# Patient Record
Sex: Female | Born: 1990
Health system: Southern US, Community
[De-identification: ages and names within clinical notes are randomized; demographics above are authoritative.]

## PROBLEM LIST (undated history)

## (undated) DIAGNOSIS — K8020 Calculus of gallbladder without cholecystitis without obstruction: Secondary | ICD-10-CM

## (undated) DIAGNOSIS — I499 Cardiac arrhythmia, unspecified: Secondary | ICD-10-CM

## (undated) DIAGNOSIS — Z8619 Personal history of other infectious and parasitic diseases: Secondary | ICD-10-CM

## (undated) DIAGNOSIS — G43909 Migraine, unspecified, not intractable, without status migrainosus: Secondary | ICD-10-CM

## (undated) DIAGNOSIS — R011 Cardiac murmur, unspecified: Secondary | ICD-10-CM

## (undated) DIAGNOSIS — Z8781 Personal history of (healed) traumatic fracture: Secondary | ICD-10-CM

## (undated) HISTORY — DX: Cardiac arrhythmia, unspecified: I49.9

## (undated) HISTORY — DX: Personal history of other infectious and parasitic diseases: Z86.19

## (undated) HISTORY — DX: Calculus of gallbladder without cholecystitis without obstruction: K80.20

## (undated) HISTORY — DX: Migraine, unspecified, not intractable, without status migrainosus: G43.909

## (undated) HISTORY — DX: Cardiac murmur, unspecified: R01.1

---

## 2011-11-19 ENCOUNTER — Ambulatory Visit (INDEPENDENT_AMBULATORY_CARE_PROVIDER_SITE_OTHER): Payer: BC Managed Care – PPO | Admitting: Family

## 2011-11-19 ENCOUNTER — Ambulatory Visit (HOSPITAL_BASED_OUTPATIENT_CLINIC_OR_DEPARTMENT_OTHER)
Admission: RE | Admit: 2011-11-19 | Discharge: 2011-11-19 | Disposition: A | Discharge status: Home / Self Care | Payer: BC Managed Care – PPO | Source: Ambulatory Visit | Attending: Family | Admitting: Family

## 2011-11-19 ENCOUNTER — Encounter: Payer: Self-pay | Admitting: Family

## 2011-11-19 ENCOUNTER — Telehealth: Payer: Self-pay | Admitting: Family

## 2011-11-19 VITALS — BP 104/72 | HR 82 | Temp 97.9°F | Resp 16 | Ht 61.0 in | Wt 161.0 lb

## 2011-11-19 DIAGNOSIS — Z0000 Encounter for general adult medical examination without abnormal findings: Secondary | ICD-10-CM

## 2011-11-19 DIAGNOSIS — Z309 Encounter for contraceptive management, unspecified: Secondary | ICD-10-CM

## 2011-11-19 DIAGNOSIS — R011 Cardiac murmur, unspecified: Secondary | ICD-10-CM | POA: Insufficient documentation

## 2011-11-19 DIAGNOSIS — G43909 Migraine, unspecified, not intractable, without status migrainosus: Secondary | ICD-10-CM | POA: Insufficient documentation

## 2011-11-19 DIAGNOSIS — E010 Iodine-deficiency related diffuse (endemic) goiter: Secondary | ICD-10-CM

## 2011-11-19 DIAGNOSIS — E049 Nontoxic goiter, unspecified: Secondary | ICD-10-CM | POA: Insufficient documentation

## 2011-11-19 DIAGNOSIS — R350 Frequency of micturition: Secondary | ICD-10-CM | POA: Insufficient documentation

## 2011-11-19 LAB — HEPATIC FUNCTION PANEL
ALT: 19 U/L (ref 0–35)
AST: 17 U/L (ref 0–37)
Albumin: 4.4 g/dL (ref 3.5–5.2)
Alkaline Phosphatase: 83 U/L (ref 39–117)
Bilirubin, Direct: 0.1 mg/dL (ref 0.0–0.3)
Total Bilirubin: 0.3 mg/dL (ref 0.3–1.2)

## 2011-11-19 LAB — POCT URINALYSIS DIPSTICK
Nitrite, UA: NEGATIVE
Protein, UA: NEGATIVE
pH, UA: 7.5

## 2011-11-19 LAB — BASIC METABOLIC PANEL WITH GFR
CO2: 23 mEq/L (ref 19–32)
Calcium: 9.7 mg/dL (ref 8.4–10.5)
Creat: 0.83 mg/dL (ref 0.50–1.10)
GFR, Est African American: >89 mL/min
Glucose, Bld: 86 mg/dL (ref 70–99)
Sodium: 141 mEq/L (ref 135–145)

## 2011-11-19 LAB — LIPID PANEL
LDL Cholesterol: 129 mg/dL — ABNORMAL HIGH (ref 0–99)
VLDL: 22 mg/dL (ref 0–40)

## 2011-11-19 MED ORDER — NORETHINDRONE ACET-ETHINYL EST 1.5-30 MG-MCG PO TABS: 1.0000 | ORAL_TABLET | Freq: Every day | ORAL | Status: DC

## 2011-11-19 MED ORDER — CIPROFLOXACIN HCL 250 MG PO TABS: 250.0000 mg | ORAL_TABLET | Freq: Two times a day (BID) | ORAL | Status: AC

## 2011-11-19 MED ORDER — KETOROLAC TROMETHAMINE 30 MG/ML IJ SOLN
30.0000 mg | Freq: Once | INTRAMUSCULAR | Status: AC
  Administered 2011-11-19: 30 mg via INTRAMUSCULAR

## 2011-11-19 MED ORDER — SUMATRIPTAN SUCCINATE 50 MG PO TABS: ORAL_TABLET | ORAL | Status: DC

## 2011-11-19 NOTE — Assessment & Plan Note (Addendum)
Will try to obtain records from [facility]. Clinically stable. I do not hear murmur today.

## 2011-11-19 NOTE — Progress Notes (Signed)
Subjective:    Patient ID: Erika Woodard, female    DOB: July 23, 1990, 21 y.o.   MRN: [id number]  HPI  Erika Woodard is a 21 yr old female who presents today to establish care.  Her chief complaint today is migraine.  Migraine- Sunday ED at [facility].  Still having migraine.  Prior to that she was seen 1 month ago in the ED with a 3 week hx of migraine.  Reports that she was given an injection which did not help.  She was given rx for tramadol which she reports  is not working.  HA is associated with+ photo/phonophobia.  Pain is located across her forehead.    Denies family hx of migraines.    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  Reports that she had some sort of cardiac procedure at [facility] when she was 21 year old. Not sure what what done.  ?enlarged thyroid per ED.  Review of Systems  Constitutional: Positive for unexpected weight change.       16  pound weight gain in 2 months.   HENT: Positive for rhinorrhea. Negative for hearing loss.   Eyes: Negative for visual disturbance.  Respiratory: Negative for cough.   Cardiovascular: Negative for chest pain.       Occasional heart palpitations  Gastrointestinal: Positive for nausea. Negative for vomiting and diarrhea.  Genitourinary: Positive for frequency. Negative for dysuria.       Irreg periods  Musculoskeletal: Negative for myalgias and arthralgias.  Skin: Negative for rash.  Neurological: Positive for headaches.  Hematological: Negative for adenopathy.  Psychiatric/Behavioral:       Denies depression/anxiety   Past Medical History  Diagnosis Date  . History of chicken pox   . Irregular heart beat   . Heart murmur     since birth  . Migraine     History   Social History  . Marital Status: Single    Spouse Name: N/A    Number of Children: 0  . Years of Education: N/A   Occupational History  . Not on file.   Social History Main Topics  . Smoking status: Never Smoker   . Smokeless tobacco: Never Used  . Alcohol  Use: No  . Drug Use: No  . Sexually Active: Not on file   Other Topics Concern  . Not on file   Social History Narrative   Regular exercise: noCaffeine use: 1-2 dailyAssociates degree Works in Clinical biochemist at [employer]    Past Surgical History  Procedure Date  . No past surgeries     Family History  Problem Relation Age of Onset  . Hypertension Mother   . Crohn's disease Mother   . Diabetes Father   . Asthma Sister   . Heart disease Neg Hx   . Kidney disease Neg Hx     No Known Allergies  Current Outpatient Prescriptions on File Prior to Visit  Medication Sig Dispense Refill  . Norethindrone Acetate-Ethinyl Estradiol (JUNEL,LOESTRIN,MICROGESTIN) 1.5-30 MG-MCG tablet Take 1 tablet by mouth daily.  1 Package  6  . SUMAtriptan (IMITREX) 50 MG tablet One tab at start of migraine.  May repeat once two hours later if no improvement.  10 tablet  0    BP 104/72  Pulse 82  Temp 97.9 F (36.6 C) (Oral)  Resp 16  Ht 5\' 1"  (1.549 m)  Wt 161 lb (73.029 kg)  BMI 30.42 kg/m2  SpO2 99%  LMP 10/18/2011       Objective:  Physical Exam  Constitutional: She is oriented to person, place, and time. She appears well-developed and well-nourished. No distress.  HENT:  Head: Normocephalic and atraumatic.  Right Ear: Tympanic membrane and ear canal normal.  Left Ear: Tympanic membrane and ear canal normal.  Mouth/Throat: No posterior oropharyngeal edema or posterior oropharyngeal erythema.  Eyes: No scleral icterus.  Neck: Normal range of motion. Thyromegaly present.  Cardiovascular: Normal rate and regular rhythm.   No murmur heard. Pulmonary/Chest: Effort normal and breath sounds normal. No respiratory distress. She has no wheezes. She has no rales. She exhibits no tenderness.  Abdominal: Soft. Bowel sounds are normal. She exhibits no distension and no mass. There is no tenderness. There is no rebound and no guarding.  Lymphadenopathy:    She has no cervical adenopathy.    Neurological: She is alert and oriented to person, place, and time.  Skin: Skin is warm and dry.  Psychiatric: She has a normal mood and affect. Her behavior is normal. Judgment and thought content normal.          Assessment & Plan:

## 2011-11-19 NOTE — Telephone Encounter (Signed)
Attempted to reach pt and left detailed message on cell # and to call if any questions.

## 2011-11-19 NOTE — Assessment & Plan Note (Signed)
Pt with recent weight gain.  Obtain TSH, obtain thyroid ultrasound.

## 2011-11-19 NOTE — Telephone Encounter (Signed)
Pls call pt and let her know ua shows possible UTI.  I have sent cipro to her pharmacy.

## 2011-11-19 NOTE — Assessment & Plan Note (Signed)
Will add imitrex prn.  IM toradol in office today.  Add OCP for birth control and hopefully will also help with HA's.

## 2011-11-19 NOTE — Patient Instructions (Addendum)
Please complete your blood work prior to leaving. Complete your thyroid ultrasound prior to leaving. Follow up in 1 month for a complete physical and pap smear.  Welcome to [facility]!

## 2011-11-19 NOTE — Assessment & Plan Note (Signed)
UA suggestive of UTI in setting of urinary frequency. Rx with Cipro.  Culture urine.

## 2011-11-20 ENCOUNTER — Encounter: Payer: Self-pay | Admitting: Family

## 2011-11-20 LAB — CBC WITH DIFFERENTIAL/PLATELET
Basophils Relative: 0 % (ref 0–1)
Eosinophils Absolute: 0.1 10*3/uL (ref 0.0–0.7)
HCT: 43 % (ref 36.0–46.0)
Hemoglobin: 13.9 g/dL (ref 12.0–15.0)
Lymphs Abs: 2.5 10*3/uL (ref 0.7–4.0)
MCH: 27.4 pg (ref 26.0–34.0)
MCHC: 32.3 g/dL (ref 30.0–36.0)
MCV: 84.6 fL (ref 78.0–100.0)
Monocytes Absolute: 0.4 10*3/uL (ref 0.1–1.0)
Monocytes Relative: 6 % (ref 3–12)
Neutrophils Relative %: 50 % (ref 43–77)
RBC: 5.08 MIL/uL (ref 3.87–5.11)

## 2011-11-20 NOTE — Telephone Encounter (Signed)
Erika Woodard, please see work note.  Pt to pick up Monday.

## 2011-11-21 LAB — URINE CULTURE: Colony Count: 9000

## 2011-12-15 ENCOUNTER — Encounter: Payer: Self-pay | Admitting: Family

## 2011-12-17 ENCOUNTER — Ambulatory Visit (INDEPENDENT_AMBULATORY_CARE_PROVIDER_SITE_OTHER): Payer: BC Managed Care – PPO | Admitting: Family

## 2011-12-17 ENCOUNTER — Encounter: Payer: Self-pay | Admitting: Family

## 2011-12-17 VITALS — BP 118/70 | HR 64 | Temp 98.1°F | Resp 16 | Ht 61.0 in | Wt 161.1 lb

## 2011-12-17 DIAGNOSIS — Z23 Encounter for immunization: Secondary | ICD-10-CM

## 2011-12-17 DIAGNOSIS — Z0000 Encounter for general adult medical examination without abnormal findings: Secondary | ICD-10-CM | POA: Insufficient documentation

## 2011-12-17 NOTE — Patient Instructions (Addendum)
Preventive Care for Adults, Female A healthy lifestyle and preventive care can promote health and wellness. Preventive health guidelines for women include the following key practices.  A routine yearly physical is a good way to check with your caregiver about your health and preventive screening. It is a chance to share any concerns and updates on your health, and to receive a thorough exam.   Visit your dentist for a routine exam and preventive care every 6 months. Brush your teeth twice a day and floss once a day. Good oral hygiene prevents tooth decay and gum disease.   The frequency of eye exams is based on your age, health, family medical history, use of contact lenses, and other factors. Follow your caregiver's recommendations for frequency of eye exams.   Eat a healthy diet. Foods like vegetables, fruits, whole grains, low-fat dairy products, and lean protein foods contain the nutrients you need without too many calories. Decrease your intake of foods high in solid fats, added sugars, and salt. Eat the right amount of calories for you.Get information about a proper diet from your caregiver, if necessary.   Regular physical exercise is one of the most important things you can do for your health. Most adults should get at least 150 minutes of moderate-intensity exercise (any activity that increases your heart rate and causes you to sweat) each week. In addition, most adults need muscle-strengthening exercises on 2 or more days a week.   Maintain a healthy weight. The body mass index (BMI) is a screening tool to identify possible weight problems. It provides an estimate of body fat based on height and weight. Your caregiver can help determine your BMI, and can help you achieve or maintain a healthy weight.For adults 20 years and older:   A BMI below 18.5 is considered underweight.   A BMI of 18.5 to 24.9 is normal.   A BMI of 25 to 29.9 is considered overweight.   A BMI of 30 and above is  considered obese.   Maintain normal blood lipids and cholesterol levels by exercising and minimizing your intake of saturated fat. Eat a balanced diet with plenty of fruit and vegetables. Blood tests for lipids and cholesterol should begin at age 20 and be repeated every 5 years. If your lipid or cholesterol levels are high, you are over 50, or you are at high risk for heart disease, you may need your cholesterol levels checked more frequently.Ongoing high lipid and cholesterol levels should be treated with medicines if diet and exercise are not effective.   If you smoke, find out from your caregiver how to quit. If you do not use tobacco, do not start.   If you are pregnant, do not drink alcohol. If you are breastfeeding, be very cautious about drinking alcohol. If you are not pregnant and choose to drink alcohol, do not exceed 1 drink per day. One drink is considered to be 12 ounces (355 mL) of beer, 5 ounces (148 mL) of wine, or 1.5 ounces (44 mL) of liquor.   Avoid use of street drugs. Do not share needles with anyone. Ask for help if you need support or instructions about stopping the use of drugs.   High blood pressure causes heart disease and increases the risk of stroke. Your blood pressure should be checked at least every 1 to 2 years. Ongoing high blood pressure should be treated with medicines if weight loss and exercise are not effective.   If you are 55 to 22   years old, ask your caregiver if you should take aspirin to prevent strokes.   Diabetes screening involves taking a blood sample to check your fasting blood sugar level. This should be done once every 3 years, after age 45, if you are within normal weight and without risk factors for diabetes. Testing should be considered at a younger age or be carried out more frequently if you are overweight and have at least 1 risk factor for diabetes.   Breast cancer screening is essential preventive care for women. You should practice "breast  self-awareness." This means understanding the normal appearance and feel of your breasts and may include breast self-examination. Any changes detected, no matter how small, should be reported to a caregiver. Women in their 20s and 30s should have a clinical breast exam (CBE) by a caregiver as part of a regular health exam every 1 to 3 years. After age 40, women should have a CBE every year. Starting at age 40, women should consider having a mammography (breast X-ray test) every year. Women who have a family history of breast cancer should talk to their caregiver about genetic screening. Women at a high risk of breast cancer should talk to their caregivers about having magnetic resonance imaging (MRI) and a mammography every year.   The Pap test is a screening test for cervical cancer. A Pap test can show cell changes on the cervix that might become cervical cancer if left untreated. A Pap test is a procedure in which cells are obtained and examined from the lower end of the uterus (cervix).   Women should have a Pap test starting at age 21.   Between ages 21 and 29, Pap tests should be repeated every 2 years.   Beginning at age 30, you should have a Pap test every 3 years as long as the past 3 Pap tests have been normal.   Some women have medical problems that increase the chance of getting cervical cancer. Talk to your caregiver about these problems. It is especially important to talk to your caregiver if a new problem develops soon after your last Pap test. In these cases, your caregiver may recommend more frequent screening and Pap tests.   The above recommendations are the same for women who have or have not gotten the vaccine for human papillomavirus (HPV).   If you had a hysterectomy for a problem that was not cancer or a condition that could lead to cancer, then you no longer need Pap tests. Even if you no longer need a Pap test, a regular exam is a good idea to make sure no other problems are  starting.   If you are between ages 65 and 70, and you have had normal Pap tests going back 10 years, you no longer need Pap tests. Even if you no longer need a Pap test, a regular exam is a good idea to make sure no other problems are starting.   If you have had past treatment for cervical cancer or a condition that could lead to cancer, you need Pap tests and screening for cancer for at least 20 years after your treatment.   If Pap tests have been discontinued, risk factors (such as a new sexual partner) need to be reassessed to determine if screening should be resumed.   The HPV test is an additional test that may be used for cervical cancer screening. The HPV test looks for the virus that can cause the cell changes on the cervix.   The cells collected during the Pap test can be tested for HPV. The HPV test could be used to screen women aged 30 years and older, and should be used in women of any age who have unclear Pap test results. After the age of 30, women should have HPV testing at the same frequency as a Pap test.   Colorectal cancer can be detected and often prevented. Most routine colorectal cancer screening begins at the age of 50 and continues through age 75. However, your caregiver may recommend screening at an earlier age if you have risk factors for colon cancer. On a yearly basis, your caregiver may provide home test kits to check for hidden blood in the stool. Use of a small camera at the end of a tube, to directly examine the colon (sigmoidoscopy or colonoscopy), can detect the earliest forms of colorectal cancer. Talk to your caregiver about this at age 50, when routine screening begins. Direct examination of the colon should be repeated every 5 to 10 years through age 75, unless early forms of pre-cancerous polyps or small growths are found.   Hepatitis C blood testing is recommended for all people born from 1945 through 1965 and any individual with known risks for hepatitis C.    Practice safe sex. Use condoms and avoid high-risk sexual practices to reduce the spread of sexually transmitted infections (STIs). STIs include gonorrhea, chlamydia, syphilis, trichomonas, herpes, HPV, and human immunodeficiency virus (HIV). Herpes, HIV, and HPV are viral illnesses that have no cure. They can result in disability, cancer, and death. Sexually active women aged 25 and younger should be checked for chlamydia. Older women with new or multiple partners should also be tested for chlamydia. Testing for other STIs is recommended if you are sexually active and at increased risk.   Osteoporosis is a disease in which the bones lose minerals and strength with aging. This can result in serious bone fractures. The risk of osteoporosis can be identified using a bone density scan. Women ages 65 and over and women at risk for fractures or osteoporosis should discuss screening with their caregivers. Ask your caregiver whether you should take a calcium supplement or vitamin D to reduce the rate of osteoporosis.   Menopause can be associated with physical symptoms and risks. Hormone replacement therapy is available to decrease symptoms and risks. You should talk to your caregiver about whether hormone replacement therapy is right for you.   Use sunscreen with sun protection factor (SPF) of 30 or more. Apply sunscreen liberally and repeatedly throughout the day. You should seek shade when your shadow is shorter than you. Protect yourself by wearing long sleeves, pants, a wide-brimmed hat, and sunglasses year round, whenever you are outdoors.   Once a month, do a whole body skin exam, using a mirror to look at the skin on your back. Notify your caregiver of new moles, moles that have irregular borders, moles that are larger than a pencil eraser, or moles that have changed in shape or color.   Stay current with required immunizations.   Influenza. You need a dose every fall (or winter). The composition of  the flu vaccine changes each year, so being vaccinated once is not enough.   Pneumococcal polysaccharide. You need 1 to 2 doses if you smoke cigarettes or if you have certain chronic medical conditions. You need 1 dose at age 65 (or older) if you have never been vaccinated.   Tetanus, diphtheria, pertussis (Tdap, Td). Get 1 dose of   Tdap vaccine if you are younger than age 65, are over 65 and have contact with an infant, are a healthcare worker, are pregnant, or simply want to be protected from whooping cough. After that, you need a Td booster dose every 10 years. Consult your caregiver if you have not had at least 3 tetanus and diphtheria-containing shots sometime in your life or have a deep or dirty wound.   HPV. You need this vaccine if you are a woman age 26 or younger. The vaccine is given in 3 doses over 6 months.   Measles, mumps, rubella (MMR). You need at least 1 dose of MMR if you were born in 1957 or later. You may also need a second dose.   Meningococcal. If you are age 19 to 21 and a first-year college student living in a residence hall, or have one of several medical conditions, you need to get vaccinated against meningococcal disease. You may also need additional booster doses.   Zoster (shingles). If you are age 60 or older, you should get this vaccine.   Varicella (chickenpox). If you have never had chickenpox or you were vaccinated but received only 1 dose, talk to your caregiver to find out if you need this vaccine.   Hepatitis A. You need this vaccine if you have a specific risk factor for hepatitis A virus infection or you simply wish to be protected from this disease. The vaccine is usually given as 2 doses, 6 to 18 months apart.   Hepatitis B. You need this vaccine if you have a specific risk factor for hepatitis B virus infection or you simply wish to be protected from this disease. The vaccine is given in 3 doses, usually over 6 months.  Preventive Services /  Frequency Ages 19 to 39  Blood pressure check.** / Every 1 to 2 years.   Lipid and cholesterol check.** / Every 5 years beginning at age 20.   Clinical breast exam.** / Every 3 years for women in their 20s and 30s.   Pap test.** / Every 2 years from ages 21 through 29. Every 3 years starting at age 30 through age 65 or 70 with a history of 3 consecutive normal Pap tests.   HPV screening.** / Every 3 years from ages 30 through ages 65 to 70 with a history of 3 consecutive normal Pap tests.   Hepatitis C blood test.** / For any individual with known risks for hepatitis C.   Skin self-exam. / Monthly.   Influenza immunization.** / Every year.   Pneumococcal polysaccharide immunization.** / 1 to 2 doses if you smoke cigarettes or if you have certain chronic medical conditions.   Tetanus, diphtheria, pertussis (Tdap, Td) immunization. / A one-time dose of Tdap vaccine. After that, you need a Td booster dose every 10 years.   HPV immunization. / 3 doses over 6 months, if you are 26 and younger.   Measles, mumps, rubella (MMR) immunization. / You need at least 1 dose of MMR if you were born in 1957 or later. You may also need a second dose.   Meningococcal immunization. / 1 dose if you are age 19 to 21 and a first-year college student living in a residence hall, or have one of several medical conditions, you need to get vaccinated against meningococcal disease. You may also need additional booster doses.   Varicella immunization.** / Consult your caregiver.   Hepatitis A immunization.** / Consult your caregiver. 2 doses, 6 to 18 months   apart.   Hepatitis B immunization.** / Consult your caregiver. 3 doses usually over 6 months.  Ages 40 to 64  Blood pressure check.** / Every 1 to 2 years.   Lipid and cholesterol check.** / Every 5 years beginning at age 20.   Clinical breast exam.** / Every year after age 40.   Mammogram.** / Every year beginning at age 40 and continuing for as  long as you are in good health. Consult with your caregiver.   Pap test.** / Every 3 years starting at age 30 through age 65 or 70 with a history of 3 consecutive normal Pap tests.   HPV screening.** / Every 3 years from ages 30 through ages 65 to 70 with a history of 3 consecutive normal Pap tests.   Fecal occult blood test (FOBT) of stool. / Every year beginning at age 50 and continuing until age 75. You may not need to do this test if you get a colonoscopy every 10 years.   Flexible sigmoidoscopy or colonoscopy.** / Every 5 years for a flexible sigmoidoscopy or every 10 years for a colonoscopy beginning at age 50 and continuing until age 75.   Hepatitis C blood test.** / For all people born from 1945 through 1965 and any individual with known risks for hepatitis C.   Skin self-exam. / Monthly.   Influenza immunization.** / Every year.   Pneumococcal polysaccharide immunization.** / 1 to 2 doses if you smoke cigarettes or if you have certain chronic medical conditions.   Tetanus, diphtheria, pertussis (Tdap, Td) immunization.** / A one-time dose of Tdap vaccine. After that, you need a Td booster dose every 10 years.   Measles, mumps, rubella (MMR) immunization. / You need at least 1 dose of MMR if you were born in 1957 or later. You may also need a second dose.   Varicella immunization.** / Consult your caregiver.   Meningococcal immunization.** / Consult your caregiver.   Hepatitis A immunization.** / Consult your caregiver. 2 doses, 6 to 18 months apart.   Hepatitis B immunization.** / Consult your caregiver. 3 doses, usually over 6 months.  Ages 65 and over  Blood pressure check.** / Every 1 to 2 years.   Lipid and cholesterol check.** / Every 5 years beginning at age 20.   Clinical breast exam.** / Every year after age 40.   Mammogram.** / Every year beginning at age 40 and continuing for as long as you are in good health. Consult with your caregiver.   Pap test.** /  Every 3 years starting at age 30 through age 65 or 70 with a 3 consecutive normal Pap tests. Testing can be stopped between 65 and 70 with 3 consecutive normal Pap tests and no abnormal Pap or HPV tests in the past 10 years.   HPV screening.** / Every 3 years from ages 30 through ages 65 or 70 with a history of 3 consecutive normal Pap tests. Testing can be stopped between 65 and 70 with 3 consecutive normal Pap tests and no abnormal Pap or HPV tests in the past 10 years.   Fecal occult blood test (FOBT) of stool. / Every year beginning at age 50 and continuing until age 75. You may not need to do this test if you get a colonoscopy every 10 years.   Flexible sigmoidoscopy or colonoscopy.** / Every 5 years for a flexible sigmoidoscopy or every 10 years for a colonoscopy beginning at age 50 and continuing until age 75.   Hepatitis   C blood test.** / For all people born from 1945 through 1965 and any individual with known risks for hepatitis C.   Osteoporosis screening.** / A one-time screening for women ages 65 and over and women at risk for fractures or osteoporosis.   Skin self-exam. / Monthly.   Influenza immunization.** / Every year.   Pneumococcal polysaccharide immunization.** / 1 dose at age 65 (or older) if you have never been vaccinated.   Tetanus, diphtheria, pertussis (Tdap, Td) immunization. / A one-time dose of Tdap vaccine if you are over 65 and have contact with an infant, are a healthcare worker, or simply want to be protected from whooping cough. After that, you need a Td booster dose every 10 years.   Varicella immunization.** / Consult your caregiver.   Meningococcal immunization.** / Consult your caregiver.   Hepatitis A immunization.** / Consult your caregiver. 2 doses, 6 to 18 months apart.   Hepatitis B immunization.** / Check with your caregiver. 3 doses, usually over 6 months.  ** Family history and personal history of risk and conditions may change your caregiver's  recommendations. Document Released: 06/01/2001 Document Revised: 03/25/2011 Document Reviewed: 08/31/2010 [employer] Patient Information 2012 [employer]. 

## 2011-12-17 NOTE — Assessment & Plan Note (Signed)
Pt counseled on diet, exercise and weight loss.  Tdap today.  Pt to schedule follow up visit for pap.

## 2011-12-17 NOTE — Progress Notes (Signed)
Subjective:    Patient ID: Erika Woodard, female    DOB: [birth year], 21 y.o.   MRN: [id number]  HPI  Preventative-  Not exercising regularly.  Diet is not that healthy. Due for pap but she wishes to reschedule to another day. Due for tetanus.   Review of Systems  Constitutional:       + weight gain last 4 months (35 pounds)  HENT: Negative for congestion.        Occasional issues with hearing  Eyes: Negative for visual disturbance.  Respiratory: Negative for cough and shortness of breath.   Cardiovascular: Negative for chest pain.  Gastrointestinal: Negative for nausea, vomiting and diarrhea.  Genitourinary: Negative for dysuria and frequency.  Musculoskeletal: Negative for myalgias and arthralgias.  Skin: Negative for rash.  Neurological: Positive for headaches.  Hematological: Negative for adenopathy.  Psychiatric/Behavioral:       Denies depression/anxiety   Past Medical History  Diagnosis Date  . History of chicken pox   . Irregular heart beat   . Heart murmur     since birth  . Migraine     History   Social History  . Marital Status: Single    Spouse Name: N/A    Number of Children: 0  . Years of Education: N/A   Occupational History  . Not on file.   Social History Main Topics  . Smoking status: Never Smoker   . Smokeless tobacco: Never Used  . Alcohol Use: No  . Drug Use: No  . Sexually Active: Not on file   Other Topics Concern  . Not on file   Social History Narrative   Regular exercise: noCaffeine use: 1-2 dailyAssociates degree Works in Clinical biochemist at [employer]    Past Surgical History  Procedure Date  . No past surgeries     Family History  Problem Relation Age of Onset  . Hypertension Mother   . Crohn's disease Mother   . Diabetes Father   . Asthma Sister   . Heart disease Neg Hx   . Kidney disease Neg Hx     No Known Allergies  Current Outpatient Prescriptions on File Prior to Visit  Medication Sig Dispense Refill  .  Norethindrone Acetate-Ethinyl Estradiol (JUNEL,LOESTRIN,MICROGESTIN) 1.5-30 MG-MCG tablet Take 1 tablet by mouth daily.  1 Package  6  . SUMAtriptan (IMITREX) 50 MG tablet One tab at start of migraine.  May repeat once two hours later if no improvement.  10 tablet  0  . traMADol (ULTRAM) 50 MG tablet Take 50 mg by mouth as needed.        BP 118/70  Pulse 64  Temp 98.1 F (36.7 C) (Oral)  Resp 16  Ht 5\' 1"  (1.549 m)  Wt 161 lb 1.3 oz (73.065 kg)  BMI 30.44 kg/m2  SpO2 97%  LMP 12/17/2011       Objective:   Physical Exam Physical Exam  Constitutional: She is oriented to person, place, and time. She appears well-developed and well-nourished. No distress.  HENT:  Head: Normocephalic and atraumatic.  Right Ear: Tympanic membrane and ear canal normal.  Left Ear: Tympanic membrane and ear canal normal.  Mouth/Throat: Oropharynx is clear and moist.  Eyes: Pupils are equal, round, and reactive to light. No scleral icterus.  Neck: Normal range of motion. No thyromegaly present.  Cardiovascular: Normal rate and regular rhythm.   No murmur heard. Pulmonary/Chest: Effort normal and breath sounds normal. No respiratory distress. He has no wheezes. She has no  rales. She exhibits no tenderness.  Abdominal: Soft. Bowel sounds are normal. He exhibits no distension and no mass. There is no tenderness. There is no rebound and no guarding.  Musculoskeletal: She exhibits no edema.  Lymphadenopathy:    She has no cervical adenopathy.  Neurological: She is alert and oriented to person, place, and time. She exhibits normal muscle tone. Coordination normal.  Skin: Skin is warm and dry.  Psychiatric: She has a normal mood and affect. Her behavior is normal. Judgment and thought content normal.  Breast/pelvic: deferred to next visit.         Assessment & Plan:          Assessment & Plan:

## 2011-12-24 ENCOUNTER — Encounter: Payer: Self-pay | Admitting: Family

## 2011-12-24 ENCOUNTER — Ambulatory Visit (INDEPENDENT_AMBULATORY_CARE_PROVIDER_SITE_OTHER): Payer: BC Managed Care – PPO | Admitting: Family

## 2011-12-24 ENCOUNTER — Other Ambulatory Visit (HOSPITAL_COMMUNITY)
Admission: RE | Admit: 2011-12-24 | Discharge: 2011-12-24 | Disposition: A | Discharge status: Home / Self Care | Payer: BC Managed Care – PPO | Source: Ambulatory Visit | Attending: Family | Admitting: Family

## 2011-12-24 VITALS — BP 90/60 | HR 72 | Temp 98.0°F | Resp 16 | Ht 61.0 in | Wt 162.0 lb

## 2011-12-24 DIAGNOSIS — G43909 Migraine, unspecified, not intractable, without status migrainosus: Secondary | ICD-10-CM

## 2011-12-24 DIAGNOSIS — Z01419 Encounter for gynecological examination (general) (routine) without abnormal findings: Secondary | ICD-10-CM | POA: Insufficient documentation

## 2011-12-24 DIAGNOSIS — Z113 Encounter for screening for infections with a predominantly sexual mode of transmission: Secondary | ICD-10-CM | POA: Insufficient documentation

## 2011-12-24 MED ORDER — LEVONORGEST-ETH ESTRAD 91-DAY 0.15-0.03 &0.01 MG PO TABS: 1.0000 | ORAL_TABLET | Freq: Every day | ORAL | Status: DC

## 2011-12-24 NOTE — Progress Notes (Signed)
  Subjective:    Patient ID: Erika Woodard, female    DOB: 11/14/1990, 21 y.o.   MRN: [id number]  HPI  Ms.  Woodard is a 21 yr old female who presents today for her pap smear.   Headaches- 3 days on 2-3 days off.  Having trouble working or driving during migraines.  Imitrex didn't help.  LMP 7/30.  Not sexually active.  Periods have been heavier than usual.  7-8 days long.  + cramping.     Review of Systems See HPI  Past Medical History  Diagnosis Date  . History of chicken pox   . Irregular heart beat   . Heart murmur     since birth  . Migraine     History   Social History  . Marital Status: Single    Spouse Name: N/A    Number of Children: 0  . Years of Education: N/A   Occupational History  . Not on file.   Social History Main Topics  . Smoking status: Never Smoker   . Smokeless tobacco: Never Used  . Alcohol Use: No  . Drug Use: No  . Sexually Active: Not on file   Other Topics Concern  . Not on file   Social History Narrative   Regular exercise: noCaffeine use: 1-2 dailyAssociates degree Works in Clinical biochemist at [employer]    Past Surgical History  Procedure Date  . No past surgeries     Family History  Problem Relation Age of Onset  . Hypertension Mother   . Crohn's disease Mother   . Diabetes Father   . Asthma Sister   . Heart disease Neg Hx   . Kidney disease Neg Hx     No Known Allergies  Current Outpatient Prescriptions on File Prior to Visit  Medication Sig Dispense Refill  . Calcium Carbonate-Vitamin D (CALTRATE 600+D) 600-400 MG-UNIT per tablet Take 1 tablet by mouth 2 (two) times daily.      . Levonorgestrel-Ethinyl Estradiol (AMETHIA,CAMRESE) 0.15-0.03 &0.01 MG tablet Take 1 tablet by mouth daily.  1 Package  4  . traMADol (ULTRAM) 50 MG tablet Take 50 mg by mouth as needed.        BP 90/60  Pulse 72  Temp 98 F (36.7 C) (Oral)  Resp 16  Ht 5\' 1"  (1.549 m)  Wt 162 lb 0.6 oz (73.501 kg)  BMI 30.62 kg/m2  SpO2 99%   LMP 12/17/2011       Objective:   Physical Exam  Constitutional: She appears well-developed and well-nourished. No distress.  Cardiovascular: Normal rate and regular rhythm.   No murmur heard. Pulmonary/Chest: Effort normal and breath sounds normal. No respiratory distress. She has no wheezes. She has no rales. She exhibits no tenderness.  Genitourinary:       Breasts: Examined lying arge or axillary adenopathy.  Left: Without masses, retractions, discharge or axillary adenopathy.  Inguinal/mons: Normal without inguinal adenopathy  External genitalia: Normal  BUS/Urethra/Skene's glands: Normal  Bladder: Normal  Vagina: Normal  Cervix: Normal (pap performed with chaperone) Uterus: normal in size, shape and contour. Midline and mobile  Adnexa/parametria:  Rt: Without masses or tenderness.  Lt: Without masses or tenderness.  Anus and perineum: Normal            Assessment & Plan:

## 2011-12-24 NOTE — Patient Instructions (Addendum)
You will be contacted about your referral to neurology for your migraines Please let us know if you have not heard back within 1 week about your referral. Follow up with [place] in 1 year, sooner if problems or concerns.

## 2011-12-24 NOTE — Assessment & Plan Note (Signed)
Unchanged.  Will refer to Neurology for further evaluation/treatment.

## 2011-12-24 NOTE — Assessment & Plan Note (Addendum)
Pap performed today.  Will also send GC/Chlamydia screen. Will change microgestin to camrese due to long/heavy periods.

## 2011-12-28 ENCOUNTER — Telehealth: Payer: Self-pay | Admitting: Family

## 2011-12-28 NOTE — Telephone Encounter (Signed)
See note

## 2012-01-04 ENCOUNTER — Telehealth: Payer: Self-pay | Admitting: *Deleted

## 2012-01-04 MED ORDER — NORETHINDRONE ACET-ETHINYL EST 1.5-30 MG-MCG PO TABS: 1.0000 | ORAL_TABLET | Freq: Every day | ORAL | Status: DC

## 2012-01-04 NOTE — Telephone Encounter (Signed)
Left detailed message on pt's cell# re: completion and to call if any questions.

## 2012-01-04 NOTE — Telephone Encounter (Signed)
Received message from pt stating she cannot afford the new OCP (3 month pk) that she was prescribed. Pt would like to go back on her previous OCP medication. Please advise.

## 2012-01-04 NOTE — Telephone Encounter (Signed)
OK sent to pharmacy.

## 2012-01-27 ENCOUNTER — Encounter: Payer: Self-pay | Admitting: Internal Medicine

## 2012-01-27 ENCOUNTER — Ambulatory Visit (INDEPENDENT_AMBULATORY_CARE_PROVIDER_SITE_OTHER): Payer: BC Managed Care – PPO | Admitting: Internal Medicine

## 2012-01-27 VITALS — BP 104/74 | HR 69 | Temp 98.5°F | Resp 12 | Wt 164.1 lb

## 2012-01-27 DIAGNOSIS — J029 Acute pharyngitis, unspecified: Secondary | ICD-10-CM

## 2012-01-27 LAB — POCT RAPID STREP A (OFFICE): Rapid Strep A Screen: NEGATIVE

## 2012-01-27 MED ORDER — AMOXICILLIN-POT CLAVULANATE 875-125 MG PO TABS: 1.0000 | ORAL_TABLET | Freq: Two times a day (BID) | ORAL | Status: AC

## 2012-01-27 NOTE — Progress Notes (Signed)
  Subjective:    Patient ID: Erika Woodard, female    DOB: Jan 17, 1991, 21 y.o.   MRN: [id number]  HPI patient presents to clinic for evaluation of sore throat. Notes over seven-day history of sore throat, slight cough and bilateral ear discomfort without drainage fever or chills. No known strep exposure. No alleviating or exacerbating factors. Does note history of recurrent pharyngitis and was previously recommended for ENT consultation however to could not keep appointment because of school commitments. Has questions about possible tonsillectomy.  Past Medical History  Diagnosis Date  . History of chicken pox   . Irregular heart beat   . Heart murmur     since birth  . Migraine    Past Surgical History  Procedure Date  . No past surgeries     reports that she has never smoked. She has never used smokeless tobacco. She reports that she does not drink alcohol or use illicit drugs. family history includes Asthma in her sister; Crohn's disease in her mother; Diabetes in her father; and Hypertension in her mother.  There is no history of Heart disease and Kidney disease. No Known Allergies   Review of Systems see history of present illness     Objective:   Physical Exam  Nursing note and vitals reviewed. Constitutional: She appears well-developed and well-nourished. No distress.  HENT:  Head: Normocephalic and atraumatic.  Right Ear: Tympanic membrane, external ear and ear canal normal.  Left Ear: Tympanic membrane, external ear and ear canal normal.  Nose: Nose normal.  Mouth/Throat: Uvula is midline and mucous membranes are normal. Posterior oropharyngeal erythema present. No oropharyngeal exudate, posterior oropharyngeal edema or tonsillar abscesses.  Eyes: Conjunctivae normal are normal. Right eye exhibits no discharge. Left eye exhibits no discharge. No scleral icterus.  Neck: Neck supple.  Pulmonary/Chest: Effort normal and breath sounds normal. No respiratory distress. She has  no wheezes. She has no rales.  Lymphadenopathy:    She has no cervical adenopathy.  Neurological: She is alert.  Skin: Skin is warm and dry. She is not diaphoretic.  Psychiatric: She has a normal mood and affect.          Assessment & Plan:

## 2012-01-27 NOTE — Assessment & Plan Note (Signed)
Rapid strep negative however there is clinical concern for possible underlying strep. Begin empiric course of Augmentin. Followup if no improvement or worsening. Patient to consider ENT consult and call back if wishes to proceed for discussion of tonsillectomy for recurrent pharyngitis.

## 2012-04-03 ENCOUNTER — Telehealth: Payer: Self-pay | Admitting: Family

## 2012-04-03 NOTE — Telephone Encounter (Signed)
Patient Information:  Caller Name: Jacqeline  Phone: [phone]  Patient: Zaraya, Delauder  Gender: Female  DOB: Jul 15, 1990  Age: 21 Years  PCP: Sandford Craze (Adults only)  Pregnant: No  Office Follow Up:  Does the office need to follow up with this patient?: No  Instructions For The Office: N/A  RN Note:  Reports that on Saturday and Sunday night she woke up gasping for breath during the night.  Symptoms  Reason For Call & Symptoms: Chest and back pain. Reports pain is worse when she takes a deep breath.  Reviewed Health History In EMR: Yes  Reviewed Medications In EMR: Yes  Reviewed Allergies In EMR: Yes  Reviewed Surgeries / Procedures: Yes  Date of Onset of Symptoms: 04/01/2012 OB:  LMP: 03/20/2012  Guideline(s) Used:  Chest Pain  Disposition Per Guideline:   Call EMS 911 Now  Reason For Disposition Reached:   Chest pain lasting longer than 5 minutes and ANY of the following:  Over 74 years old Over 68 years old and at least one cardiac risk factor (i.e., high blood pressure, diabetes, high cholesterol, obesity, smoker or strong family history of heart disease) Pain is crushing, pressure-like, or heavy  Took nitroglycerin and chest pain was not relieved History of heart disease (i.e., angina, heart attack, bypass surgery, angioplasty, CHF)  Advice Given:  N/A  Patient Refused Recommendation:  Patient Will Go To ED  Patient does not want to call 911, she will have someone take her to the ED at this time instead.

## 2012-04-05 ENCOUNTER — Telehealth: Payer: Self-pay | Admitting: Family

## 2012-04-05 NOTE — Telephone Encounter (Signed)
Patient states that she went to [facility] ED on Monday for chest pains. She says that the physicians did not find anything wrong but she states that she is still having chest pains. When should patient be seen for this?

## 2012-04-05 NOTE — Telephone Encounter (Signed)
Notified pt, she voices understanding. Call transferred to front office to schedule f/u for Friday.

## 2012-04-05 NOTE — Telephone Encounter (Signed)
I have reviewed records.  I would like her to start aleve 220mg  twice daily, continue abx prescribed by ED for urinary tract infection, and plan follow up in office on Friday.  If symptoms worsen in the meantime, she should return to the ER.

## 2012-04-05 NOTE — Telephone Encounter (Signed)
I have faxed a request for records. Please advise.

## 2012-04-06 NOTE — Telephone Encounter (Signed)
See 04/05/12 phone note.

## 2012-04-07 ENCOUNTER — Ambulatory Visit (INDEPENDENT_AMBULATORY_CARE_PROVIDER_SITE_OTHER): Payer: BC Managed Care – PPO | Admitting: Family

## 2012-04-07 ENCOUNTER — Encounter: Payer: Self-pay | Admitting: Family

## 2012-04-07 VITALS — BP 98/70 | HR 73 | Temp 98.7°F | Resp 16 | Wt 158.1 lb

## 2012-04-07 DIAGNOSIS — S335XXA Sprain of ligaments of lumbar spine, initial encounter: Secondary | ICD-10-CM

## 2012-04-07 DIAGNOSIS — M545 Low back pain, unspecified: Secondary | ICD-10-CM | POA: Insufficient documentation

## 2012-04-07 DIAGNOSIS — R0789 Other chest pain: Secondary | ICD-10-CM

## 2012-04-07 DIAGNOSIS — S39012A Strain of muscle, fascia and tendon of lower back, initial encounter: Secondary | ICD-10-CM

## 2012-04-07 DIAGNOSIS — R071 Chest pain on breathing: Secondary | ICD-10-CM

## 2012-04-07 MED ORDER — CYCLOBENZAPRINE HCL 5 MG PO TABS: 5.0000 mg | ORAL_TABLET | Freq: Every evening | ORAL | Status: DC | PRN

## 2012-04-07 MED ORDER — NAPROXEN 500 MG PO TABS: 500.0000 mg | ORAL_TABLET | Freq: Two times a day (BID) | ORAL | Status: DC

## 2012-04-07 NOTE — Assessment & Plan Note (Signed)
Rx with naproxen.

## 2012-04-07 NOTE — Patient Instructions (Addendum)
Please call if symptoms worsen or if no improvement in 1 week.  

## 2012-04-07 NOTE — Assessment & Plan Note (Signed)
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  Rx with Naproxen.

## 2012-04-07 NOTE — Progress Notes (Signed)
  Subjective:    Patient ID: Erika Woodard, female    DOB: [birth year], 21 y.o.   MRN: [id number]  HPI  Erika Woodard is a 21 yr old female who presents today for ED follow up.  Went to [facility] ED on Monday 12/16 for CP/SOB and back pain.  ED records are reviewed.  Labs unremarkable except + d. Dimer.  CT angio of the chest was negative.  She was given ? Bactrim for UTI, though review of final culture data shows that urine culture was negative.  Still having SOB, anterior chest pain substernal tightness- worse with movement or a deep breath.  She denies fever.  + nausea due to abx.      Review of Systems See HPI  Past Medical History  Diagnosis Date  . History of chicken pox   . Irregular heart beat   . Heart murmur     since birth  . Migraine     History   Social History  . Marital Status: Single    Spouse Name: N/A    Number of Children: 0  . Years of Education: N/A   Occupational History  . Not on file.   Social History Main Topics  . Smoking status: Never Smoker   . Smokeless tobacco: Never Used  . Alcohol Use: No  . Drug Use: No  . Sexually Active: Not on file   Other Topics Concern  . Not on file   Social History Narrative   Regular exercise: noCaffeine use: 1-2 dailyAssociates degree Works in Clinical biochemist at [employer]    Past Surgical History  Procedure Date  . No past surgeries     Family History  Problem Relation Age of Onset  . Hypertension Mother   . Crohn's disease Mother   . Diabetes Father   . Asthma Sister   . Heart disease Neg Hx   . Kidney disease Neg Hx     No Known Allergies  Current Outpatient Prescriptions on File Prior to Visit  Medication Sig Dispense Refill  . Calcium Carbonate-Vitamin D (CALTRATE 600+D) 600-400 MG-UNIT per tablet Take 1 tablet by mouth 2 (two) times daily.      . Norethindrone Acetate-Ethinyl Estradiol (JUNEL,LOESTRIN,MICROGESTIN) 1.5-30 MG-MCG tablet Take 1 tablet by mouth daily.  1 Package  11    BP 98/70   Pulse 73  Temp 98.7 F (37.1 C) (Oral)  Resp 16  Wt 158 lb 1.9 oz (71.723 kg)  SpO2 99%       Objective:   Physical Exam  Constitutional: She appears well-developed and well-nourished. No distress.  Cardiovascular: Normal rate and regular rhythm.   No murmur heard. Pulmonary/Chest: Effort normal and breath sounds normal. No respiratory distress. She has no wheezes. She has no rales. She exhibits tenderness.  Musculoskeletal:       Thoracic back: She exhibits tenderness.       Lumbar back: She exhibits tenderness.  Skin: Skin is warm and dry.  Psychiatric: She has a normal mood and affect. Her behavior is normal. Judgment and thought content normal.          Assessment & Plan:  Nausea- likely related to abx- culture neg-  Pt instructed to stop abx.

## 2012-04-14 ENCOUNTER — Ambulatory Visit: Payer: BC Managed Care – PPO | Admitting: Family

## 2012-04-18 ENCOUNTER — Encounter (HOSPITAL_BASED_OUTPATIENT_CLINIC_OR_DEPARTMENT_OTHER): Payer: Self-pay | Admitting: *Deleted

## 2012-04-18 ENCOUNTER — Emergency Department (HOSPITAL_BASED_OUTPATIENT_CLINIC_OR_DEPARTMENT_OTHER)
Admission: EM | Admit: 2012-04-18 | Discharge: 2012-04-18 | Disposition: A | Discharge status: Home / Self Care | Payer: BC Managed Care – PPO | Attending: Emergency Medicine | Admitting: Emergency Medicine

## 2012-04-18 ENCOUNTER — Emergency Department (HOSPITAL_BASED_OUTPATIENT_CLINIC_OR_DEPARTMENT_OTHER): Payer: BC Managed Care – PPO

## 2012-04-18 DIAGNOSIS — R071 Chest pain on breathing: Secondary | ICD-10-CM | POA: Insufficient documentation

## 2012-04-18 DIAGNOSIS — N390 Urinary tract infection, site not specified: Secondary | ICD-10-CM | POA: Insufficient documentation

## 2012-04-18 DIAGNOSIS — Z8679 Personal history of other diseases of the circulatory system: Secondary | ICD-10-CM | POA: Insufficient documentation

## 2012-04-18 DIAGNOSIS — R079 Chest pain, unspecified: Secondary | ICD-10-CM

## 2012-04-18 DIAGNOSIS — M549 Dorsalgia, unspecified: Secondary | ICD-10-CM | POA: Insufficient documentation

## 2012-04-18 DIAGNOSIS — R51 Headache: Secondary | ICD-10-CM | POA: Insufficient documentation

## 2012-04-18 DIAGNOSIS — Z87898 Personal history of other specified conditions: Secondary | ICD-10-CM | POA: Insufficient documentation

## 2012-04-18 DIAGNOSIS — R0602 Shortness of breath: Secondary | ICD-10-CM | POA: Insufficient documentation

## 2012-04-18 DIAGNOSIS — Z791 Long term (current) use of non-steroidal anti-inflammatories (NSAID): Secondary | ICD-10-CM | POA: Insufficient documentation

## 2012-04-18 DIAGNOSIS — Z79899 Other long term (current) drug therapy: Secondary | ICD-10-CM | POA: Insufficient documentation

## 2012-04-18 LAB — URINALYSIS, ROUTINE W REFLEX MICROSCOPIC
Bilirubin Urine: NEGATIVE
Ketones, ur: NEGATIVE mg/dL
Nitrite: NEGATIVE
Protein, ur: NEGATIVE mg/dL

## 2012-04-18 LAB — CBC WITH DIFFERENTIAL/PLATELET
Basophils Absolute: 0 10*3/uL (ref 0.0–0.1)
Basophils Relative: 1 % (ref 0–1)
Eosinophils Absolute: 0.1 10*3/uL (ref 0.0–0.7)
MCHC: 34.1 g/dL (ref 30.0–36.0)
Neutro Abs: 2.6 10*3/uL (ref 1.7–7.7)
Neutrophils Relative %: 50 % (ref 43–77)
Platelets: 295 10*3/uL (ref 150–400)
RDW: 13.8 % (ref 11.5–15.5)

## 2012-04-18 LAB — D-DIMER, QUANTITATIVE: D-Dimer, Quant: 0.39 ug/mL-FEU (ref 0.00–0.48)

## 2012-04-18 MED ORDER — GI COCKTAIL ~~LOC~~
30.0000 mL | Freq: Once | ORAL | Status: AC
  Administered 2012-04-18: 30 mL via ORAL
  Filled 2012-04-18: qty 30

## 2012-04-18 NOTE — ED Provider Notes (Signed)
History     CSN: [id number]  Arrival date & time 04/18/12  0808   First MD Initiated Contact with Patient 04/18/12 0818      Chief Complaint  Patient presents with  . Chest Pain    (Consider location/radiation/quality/duration/timing/severity/associated sxs/prior treatment) HPI  Patient complaining of chest pain began two weeks ago awoke from sleep left side chest now diffuse throught chest, with associated sob, pain with deep breath, pain with inspiration and notices with exertion.  Seen at [facility] on 12/16 and evaluated with ekg, cxr,ct, and blood work and told normal.  Prescribed tramadol and antibiotic for uti, did not take abx because pmd told her to stop.  She was seen by her pmd Sandford Craze on 12/20.  Evaluted with physical exam and placed on nsaid and flexeril.  LMP early December.  Past Medical History  Diagnosis Date  . History of chicken pox   . Irregular heart beat   . Heart murmur     since birth  . Migraine     Past Surgical History  Procedure Date  . No past surgeries     Family History  Problem Relation Age of Onset  . Hypertension Mother   . Crohn's disease Mother   . Diabetes Father   . Asthma Sister   . Heart disease Neg Hx   . Kidney disease Neg Hx     History  Substance Use Topics  . Smoking status: Never Smoker   . Smokeless tobacco: Never Used  . Alcohol Use: No    OB History    Grav Para Term Preterm Abortions TAB SAB Ect Mult Living                  Review of Systems  Constitutional: Negative.   Eyes: Negative.   Respiratory: Positive for cough and shortness of breath.   Cardiovascular: Positive for chest pain. Negative for leg swelling.  Genitourinary: Negative.   Musculoskeletal: Positive for back pain.  Neurological: Positive for headaches.  Hematological: Negative.   Psychiatric/Behavioral: Negative.     Allergies  Review of patient's allergies indicates no known allergies.  Home Medications   Current  Outpatient Rx  Name  Route  Sig  Dispense  Refill  . CALCIUM CARBONATE-VITAMIN D 600-400 MG-UNIT PO TABS   Oral   Take 1 tablet by mouth 2 (two) times daily.         . CYCLOBENZAPRINE HCL 5 MG PO TABS   Oral   Take 1 tablet (5 mg total) by mouth at bedtime as needed for muscle spasms.   10 tablet   0   . IBUPROFEN 200 MG PO TABS   Oral   Take 200 mg by mouth 2 (two) times daily.         Marland Kitchen NAPROXEN 500 MG PO TABS   Oral   Take 1 tablet (500 mg total) by mouth 2 (two) times daily with a meal.   20 tablet   0   . NORETHINDRONE ACET-ETHINYL EST 1.5-30 MG-MCG PO TABS   Oral   Take 1 tablet by mouth daily.   1 Package   11     BP 120/53  Pulse 77  Temp 97.8 F (36.6 C) (Oral)  Resp 16  SpO2 100%  LMP 03/20/2012  Physical Exam  Nursing note and vitals reviewed. Constitutional: She appears well-developed and well-nourished.  HENT:  Head: Normocephalic and atraumatic.  Eyes: Conjunctivae normal and EOM are normal. Pupils are equal, round, and  reactive to light.  Neck: Normal range of motion. Neck supple.  Cardiovascular: Normal rate, regular rhythm, normal heart sounds and intact distal pulses.   Pulmonary/Chest: Effort normal and breath sounds normal.  Abdominal: Soft. Bowel sounds are normal.  Musculoskeletal: Normal range of motion.  Neurological: She is alert.  Skin: Skin is warm and dry.  Psychiatric: She has a normal mood and affect. Thought content normal.    ED Course  Procedures (including critical care time)  Labs Reviewed - No data to display No results found.   No diagnosis found. Date: 04/18/2012  Rate: 72  Rhythm: normal sinus rhythm  QRS Axis: normal  Intervals: normal  ST/T Wave abnormalities: normal  Conduction Disutrbances: none  Narrative Interpretation: unremarkable       MDM  Labs and CT studies reviewed from [facility] and patient had normal CT angiogram at that time. Her d-dimer status normal. EKG and chest x-Josefine Fuhr  are normal. Unclear etiology of her chest and back pain. Patient advised of primary care Dr.       Hilario Quarry, MD 04/18/12 1010

## 2012-04-18 NOTE — ED Notes (Signed)
Pt reports chest pain x 2 weeks, has been seen at [facility] and [facility] for same-dx with inflammation (told to come to ED if not improving). Generalized across chest, associated with shortness of breath and back pain. Pain described as heaviness, "something sitting on chest". States feels she is getting a cold now. Denies nausea/vomiting.

## 2012-04-20 LAB — URINE CULTURE

## 2012-04-26 ENCOUNTER — Ambulatory Visit (INDEPENDENT_AMBULATORY_CARE_PROVIDER_SITE_OTHER): Payer: Self-pay | Admitting: Family

## 2012-04-26 ENCOUNTER — Encounter: Payer: Self-pay | Admitting: Family

## 2012-04-26 ENCOUNTER — Ambulatory Visit (HOSPITAL_BASED_OUTPATIENT_CLINIC_OR_DEPARTMENT_OTHER)
Admission: RE | Admit: 2012-04-26 | Discharge: 2012-04-26 | Disposition: A | Discharge status: Home / Self Care | Payer: Self-pay | Source: Ambulatory Visit | Attending: Family | Admitting: Family

## 2012-04-26 VITALS — BP 108/70 | HR 80 | Temp 98.4°F | Resp 16 | Ht 61.0 in | Wt 160.1 lb

## 2012-04-26 DIAGNOSIS — M545 Low back pain, unspecified: Secondary | ICD-10-CM | POA: Insufficient documentation

## 2012-04-26 DIAGNOSIS — R0789 Other chest pain: Secondary | ICD-10-CM

## 2012-04-26 DIAGNOSIS — Z23 Encounter for immunization: Secondary | ICD-10-CM

## 2012-04-26 DIAGNOSIS — S39012A Strain of muscle, fascia and tendon of lower back, initial encounter: Secondary | ICD-10-CM

## 2012-04-26 DIAGNOSIS — R0602 Shortness of breath: Secondary | ICD-10-CM

## 2012-04-26 DIAGNOSIS — S335XXA Sprain of ligaments of lumbar spine, initial encounter: Secondary | ICD-10-CM

## 2012-04-26 DIAGNOSIS — Z0189 Encounter for other specified special examinations: Secondary | ICD-10-CM

## 2012-04-26 MED ORDER — ALBUTEROL SULFATE (2.5 MG/3ML) 0.083% IN NEBU
2.5000 mg | INHALATION_SOLUTION | Freq: Once | RESPIRATORY_TRACT | Status: AC
  Administered 2012-04-26: 2.5 mg via RESPIRATORY_TRACT

## 2012-04-26 MED ORDER — FLUTICASONE-SALMETEROL 100-50 MCG/DOSE IN AEPB: 1.0000 | INHALATION_SPRAY | Freq: Two times a day (BID) | RESPIRATORY_TRACT | Status: DC

## 2012-04-26 MED ORDER — METHYLPREDNISOLONE 4 MG PO KIT: PACK | ORAL | Status: DC

## 2012-04-26 MED ORDER — OMEPRAZOLE 40 MG PO CPDR: 40.0000 mg | DELAYED_RELEASE_CAPSULE | Freq: Every day | ORAL | Status: DC

## 2012-04-26 NOTE — Assessment & Plan Note (Signed)
Mildly reproducible, but worse with laying flat.  Trial of PPI to see if this helps her symptoms.

## 2012-04-26 NOTE — Assessment & Plan Note (Signed)
No improvement with NSAIDS, flexeril, will obtain x ray of the lumbar spine

## 2012-04-26 NOTE — Progress Notes (Signed)
Subjective:    Patient ID: Erika Woodard, female    DOB: 1990-07-16, 22 y.o.   MRN: [id number]  HPI  Chest pain- Went to [facility] ED on Monday 12/16 for CP/SOB and back pain. ED records are reviewed. Labs unremarkable except + d. Dimer. CT angio of the chest was negative. She continues to have pain and shortness of breath.  Pain is across the front of the chest.  Not worsened by activity.  Pain is worse at night when she lays flat.  She reports GI cocktail did not help. Denies anxiety.  Low back pain- reports no significant improvement despite use of advil, naproxen and flexeril.  Reports not sleeping well due to the pain.     Review of Systems See HPI  Past Medical History  Diagnosis Date  . History of chicken pox   . Irregular heart beat   . Heart murmur     since birth  . Migraine     History   Social History  . Marital Status: Single    Spouse Name: N/A    Number of Children: 0  . Years of Education: N/A   Occupational History  . Not on file.   Social History Main Topics  . Smoking status: Never Smoker   . Smokeless tobacco: Never Used  . Alcohol Use: No  . Drug Use: No  . Sexually Active: Not on file   Other Topics Concern  . Not on file   Social History Narrative   Regular exercise: noCaffeine use: 1-2 dailyAssociates degree Works in Clinical biochemist at [employer]    Past Surgical History  Procedure Date  . No past surgeries     Family History  Problem Relation Age of Onset  . Hypertension Mother   . Crohn's disease Mother   . Diabetes Father   . Asthma Sister   . Heart disease Neg Hx   . Kidney disease Neg Hx     No Known Allergies  Current Outpatient Prescriptions on File Prior to Visit  Medication Sig Dispense Refill  . Calcium Carbonate-Vitamin D (CALTRATE 600+D) 600-400 MG-UNIT per tablet Take 1 tablet by mouth 2 (two) times daily.      Marland Kitchen ibuprofen (ADVIL,MOTRIN) 200 MG tablet Take 200 mg by mouth 2 (two) times daily.      . Norethindrone  Acetate-Ethinyl Estradiol (JUNEL,LOESTRIN,MICROGESTIN) 1.5-30 MG-MCG tablet Take 1 tablet by mouth daily.  1 Package  11  . cyclobenzaprine (FLEXERIL) 5 MG tablet Take 1 tablet (5 mg total) by mouth at bedtime as needed for muscle spasms.  10 tablet  0  . naproxen (NAPROSYN) 500 MG tablet Take 1 tablet (500 mg total) by mouth 2 (two) times daily with a meal.  20 tablet  0  . omeprazole (PRILOSEC) 40 MG capsule Take 1 capsule (40 mg total) by mouth daily.  30 capsule  2    BP 108/70  Pulse 80  Temp 98.4 F (36.9 C) (Oral)  Resp 16  Ht 5\' 1"  (1.549 m)  Wt 160 lb 1.9 oz (72.63 kg)  BMI 30.25 kg/m2  SpO2 98%  LMP 03/20/2012       Objective:   Physical Exam  Constitutional: She is oriented to person, place, and time. She appears well-developed and well-nourished. No distress.  HENT:  Head: Normocephalic and atraumatic.  Cardiovascular: Normal rate and regular rhythm.   No murmur heard. Pulmonary/Chest: Effort normal and breath sounds normal. No respiratory distress. She has no wheezes. She has no rales.  She exhibits no tenderness.  Musculoskeletal: She exhibits no edema.       Mild tenderness to palpation over sternum  Neurological: She is alert and oriented to person, place, and time.  Skin: Skin is warm and dry.  Psychiatric: She has a normal mood and affect. Her behavior is normal. Judgment and thought content normal.          Assessment & Plan:

## 2012-04-26 NOTE — Patient Instructions (Addendum)
Please complete your x ray on the first floor.  Start omeprazole and advair.  Follow up in 1 month.

## 2012-04-28 ENCOUNTER — Telehealth: Payer: Self-pay | Admitting: Family

## 2012-04-28 DIAGNOSIS — M545 Low back pain, unspecified: Secondary | ICD-10-CM

## 2012-05-26 ENCOUNTER — Ambulatory Visit: Payer: Self-pay | Admitting: Family

## 2012-06-07 ENCOUNTER — Encounter: Payer: Self-pay | Admitting: Family

## 2012-06-07 ENCOUNTER — Ambulatory Visit (INDEPENDENT_AMBULATORY_CARE_PROVIDER_SITE_OTHER): Payer: Self-pay | Admitting: Family

## 2012-06-07 VITALS — BP 106/68 | HR 67 | Temp 99.4°F | Resp 16 | Wt 163.1 lb

## 2012-06-07 DIAGNOSIS — M545 Low back pain, unspecified: Secondary | ICD-10-CM

## 2012-06-07 NOTE — Progress Notes (Signed)
Subjective:    Patient ID: Erika Woodard, female    DOB: 19-Jan-1991, 22 y.o.   MRN: [id number]  HPI  Erika Woodard is a 22 yr old female who presents today with chief complaint of back pain.  She went to the [facility] ED yesterday.  She did not take the prednisone pack.  No further tests.  She continues to have mid low back pain which radiates around on both sides into her lower abdomen.  Pain is worse with sitting or laying down.  She is currently without insurance.  She started a steroid taper rx'd by the ED.  Not yet started PT due to lack of insurance. She hopes to have insurance again soon.  Review of Systems See HPI  Past Medical History  Diagnosis Date  . History of chicken pox   . Irregular heart beat   . Heart murmur     since birth  . Migraine     History   Social History  . Marital Status: Single    Spouse Name: N/A    Number of Children: 0  . Years of Education: N/A   Occupational History  . Not on file.   Social History Main Topics  . Smoking status: Never Smoker   . Smokeless tobacco: Never Used  . Alcohol Use: No  . Drug Use: No  . Sexually Active: Not on file   Other Topics Concern  . Not on file   Social History Narrative   Regular exercise: no   Caffeine use: 1-2 daily   Associates degree    Works in Clinical biochemist at [employer]             Past Surgical History  Procedure Laterality Date  . No past surgeries      Family History  Problem Relation Age of Onset  . Hypertension Mother   . Crohn's disease Mother   . Diabetes Father   . Asthma Sister   . Heart disease Neg Hx   . Kidney disease Neg Hx     No Known Allergies  Current Outpatient Prescriptions on File Prior to Visit  Medication Sig Dispense Refill  . Calcium Carbonate-Vitamin D (CALTRATE 600+D) 600-400 MG-UNIT per tablet Take 1 tablet by mouth 2 (two) times daily.      . Fluticasone-Salmeterol (ADVAIR) 100-50 MCG/DOSE AEPB Inhale 1 puff into the lungs 2 (two) times daily.  1  each  3  . ibuprofen (ADVIL,MOTRIN) 200 MG tablet Take 200 mg by mouth 2 (two) times daily.      . methylPREDNISolone (MEDROL DOSEPAK) 4 MG tablet follow package directions  21 tablet  0  . Norethindrone Acetate-Ethinyl Estradiol (JUNEL,LOESTRIN,MICROGESTIN) 1.5-30 MG-MCG tablet Take 1 tablet by mouth daily.  1 Package  11  . omeprazole (PRILOSEC) 40 MG capsule Take 1 capsule (40 mg total) by mouth daily.  30 capsule  2   No current facility-administered medications on file prior to visit.    BP 106/68  Pulse 67  Temp(Src) 99.4 F (37.4 C) (Oral)  Resp 16  Wt 163 lb 1.3 oz (73.973 kg)  BMI 30.83 kg/m2  SpO2 97%  LMP 05/16/2012       Objective:   Physical Exam  Constitutional: She appears well-developed and well-nourished. No distress.  Cardiovascular: Normal rate and regular rhythm.   No murmur heard. Pulmonary/Chest: Effort normal and breath sounds normal. No respiratory distress. She has no wheezes. She has no rales. She exhibits no tenderness.  Musculoskeletal: She exhibits no  edema.       Thoracic back: She exhibits tenderness.       Lumbar back: She exhibits tenderness.  Bilateral LE strength is 5/5        Assessment & Plan:

## 2012-06-07 NOTE — Assessment & Plan Note (Signed)
Unchanged. She did take medrol dose pak last visit as rx'd.  New rx provided by ED last night.  Lumbar x ray unremarkable.  Recommend that she complete the steroid taper, start PT as soon as insurance is current.  If no improvement with these measures, consider MRI of the lumbar spine.

## 2012-06-07 NOTE — Patient Instructions (Addendum)
Let us know when your insurance is active so that we can arrange physical therapy. Complete your steroid taper prescribed by the ED. Follow up in 6 weeks, sooner if symptoms worsen.

## 2012-06-17 DIAGNOSIS — Z8781 Personal history of (healed) traumatic fracture: Secondary | ICD-10-CM

## 2012-06-17 HISTORY — DX: Personal history of (healed) traumatic fracture: Z87.81

## 2012-06-21 ENCOUNTER — Ambulatory Visit (INDEPENDENT_AMBULATORY_CARE_PROVIDER_SITE_OTHER): Payer: Self-pay | Admitting: Family

## 2012-06-21 ENCOUNTER — Encounter: Payer: Self-pay | Admitting: Family

## 2012-06-21 ENCOUNTER — Telehealth: Payer: Self-pay | Admitting: Family

## 2012-06-21 VITALS — BP 100/70 | HR 77 | Temp 98.4°F | Resp 16

## 2012-06-21 DIAGNOSIS — H5710 Ocular pain, unspecified eye: Secondary | ICD-10-CM

## 2012-06-21 DIAGNOSIS — H5711 Ocular pain, right eye: Secondary | ICD-10-CM

## 2012-06-21 MED ORDER — CYCLOBENZAPRINE HCL 5 MG PO TABS: 5.0000 mg | ORAL_TABLET | Freq: Three times a day (TID) | ORAL | Status: DC | PRN
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
## 2012-06-21 NOTE — Patient Instructions (Signed)
Please follow up in 2 weeks

## 2012-06-21 NOTE — Telephone Encounter (Signed)
Received medical records from [facility]  P: [phone] F: [phone]
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
## 2012-06-29 ENCOUNTER — Telehealth: Payer: Self-pay | Admitting: Family

## 2012-06-29 ENCOUNTER — Encounter: Payer: Self-pay | Admitting: Family

## 2012-06-29 NOTE — Telephone Encounter (Signed)
See letter.

## 2012-06-29 NOTE — Telephone Encounter (Signed)
Patient states that before her employer will complete her FMLA paperwork she needs Efraim Kaufmann to fax over a work note from when she was admitted to [facility] to 07/05/12 or until further notice.  Fax# [phone] attn: kim

## 2012-06-29 NOTE — Telephone Encounter (Signed)
Letter faxed, notified pt.

## 2012-07-05 ENCOUNTER — Encounter: Payer: Self-pay | Admitting: Family

## 2012-07-05 ENCOUNTER — Ambulatory Visit (INDEPENDENT_AMBULATORY_CARE_PROVIDER_SITE_OTHER): Payer: Self-pay | Admitting: Family

## 2012-07-05 DIAGNOSIS — M549 Dorsalgia, unspecified: Secondary | ICD-10-CM

## 2012-07-05 MED ORDER — ZOLPIDEM TARTRATE 5 MG PO TABS: 5.0000 mg | ORAL_TABLET | Freq: Every evening | ORAL | Status: DC | PRN

## 2012-07-05 NOTE — Patient Instructions (Addendum)
Please continue to follow with opthalmology. You may use ambien at bedtime as needed for the next few weeks.  Follow up in 6 weeks.

## 2012-07-05 NOTE — Assessment & Plan Note (Signed)
She continues to recover but slowly.  I think it is reasonable for her to return to work next Monday.  Will add ambien for short term use to help her sleep.  I advised her to continue to follow with the opthalmologist due to problems with the vision in her right eye.
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
## 2012-07-11 ENCOUNTER — Encounter: Payer: Self-pay | Admitting: Family

## 2012-07-13 ENCOUNTER — Telehealth: Payer: Self-pay | Admitting: Family

## 2012-07-13 NOTE — Telephone Encounter (Signed)
Received letter from employer re: request for "confirmation of medical necessity to be placed on FMLA leave." left message for [employer] at [phone] ext [phone]. Requested that they fax [place] formal FMLA form with pt release of info.  Left our number for call back if questions.

## 2012-07-17 ENCOUNTER — Telehealth: Payer: Self-pay | Admitting: *Deleted

## 2012-07-17 NOTE — Telephone Encounter (Signed)
Received FMLA paperwork from [employer] Forms forwarded to Provider for completion.

## 2012-07-19 ENCOUNTER — Ambulatory Visit: Payer: Self-pay | Admitting: Family

## 2012-07-20 DIAGNOSIS — Z0279 Encounter for issue of other medical certificate: Secondary | ICD-10-CM

## 2012-07-20 NOTE — Telephone Encounter (Signed)
Form completed.

## 2012-07-24 NOTE — Telephone Encounter (Signed)
Form faxed to [phone] on 07/21/12.

## 2012-08-14 ENCOUNTER — Encounter: Payer: Self-pay | Admitting: Family

## 2012-08-14 ENCOUNTER — Ambulatory Visit (INDEPENDENT_AMBULATORY_CARE_PROVIDER_SITE_OTHER): Payer: BC Managed Care – PPO | Admitting: Family

## 2012-08-14 DIAGNOSIS — M549 Dorsalgia, unspecified: Secondary | ICD-10-CM

## 2012-08-14 MED ORDER — ALBUTEROL SULFATE HFA 108 (90 BASE) MCG/ACT IN AERS: 2.0000 | INHALATION_SPRAY | Freq: Four times a day (QID) | RESPIRATORY_TRACT | Status: DC | PRN

## 2012-08-14 MED ORDER — NORETHINDRONE ACET-ETHINYL EST 1.5-30 MG-MCG PO TABS: 1.0000 | ORAL_TABLET | Freq: Every day | ORAL | Status: DC
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
## 2012-08-14 NOTE — Patient Instructions (Addendum)
You may use tylenol as needed for pain. If needed you may use ibuprofen, but this should be used sparingly. Please follow up in 6 months for a complete physical. Call sooner if problems/concerns.

## 2012-08-14 NOTE — Assessment & Plan Note (Signed)
She continues to improve.  I advised her to try to transition from ibuprofen to tylenol as needed for pain. She is requesting a refill on her OCP and this has been sent to her pharmacy.

## 2012-10-23 ENCOUNTER — Telehealth: Payer: Self-pay | Admitting: Family

## 2012-10-23 NOTE — Telephone Encounter (Signed)
FYI
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
## 2012-10-27 ENCOUNTER — Telehealth: Payer: Self-pay | Admitting: Family

## 2012-10-27 NOTE — Telephone Encounter (Signed)
Received medical records from [facility] for upcoming visit on 11/06/12

## 2012-11-06 ENCOUNTER — Ambulatory Visit: Payer: BC Managed Care – PPO | Admitting: Family

## 2012-11-09 ENCOUNTER — Encounter (HOSPITAL_BASED_OUTPATIENT_CLINIC_OR_DEPARTMENT_OTHER): Payer: Self-pay

## 2012-11-09 ENCOUNTER — Emergency Department (HOSPITAL_BASED_OUTPATIENT_CLINIC_OR_DEPARTMENT_OTHER)
Admission: EM | Admit: 2012-11-09 | Discharge: 2012-11-09 | Disposition: A | Discharge status: Home / Self Care | Payer: Self-pay | Attending: Emergency Medicine | Admitting: Emergency Medicine

## 2012-11-09 ENCOUNTER — Emergency Department (HOSPITAL_BASED_OUTPATIENT_CLINIC_OR_DEPARTMENT_OTHER): Payer: Self-pay

## 2012-11-09 DIAGNOSIS — R071 Chest pain on breathing: Secondary | ICD-10-CM | POA: Insufficient documentation

## 2012-11-09 DIAGNOSIS — R0602 Shortness of breath: Secondary | ICD-10-CM | POA: Insufficient documentation

## 2012-11-09 DIAGNOSIS — Z8679 Personal history of other diseases of the circulatory system: Secondary | ICD-10-CM | POA: Insufficient documentation

## 2012-11-09 DIAGNOSIS — R0789 Other chest pain: Secondary | ICD-10-CM

## 2012-11-09 DIAGNOSIS — Z8619 Personal history of other infectious and parasitic diseases: Secondary | ICD-10-CM | POA: Insufficient documentation

## 2012-11-09 DIAGNOSIS — Z87828 Personal history of other (healed) physical injury and trauma: Secondary | ICD-10-CM | POA: Insufficient documentation

## 2012-11-09 DIAGNOSIS — Z79899 Other long term (current) drug therapy: Secondary | ICD-10-CM | POA: Insufficient documentation

## 2012-11-09 DIAGNOSIS — R011 Cardiac murmur, unspecified: Secondary | ICD-10-CM | POA: Insufficient documentation

## 2012-11-09 MED ORDER — PREDNISONE 20 MG PO TABS: ORAL_TABLET | ORAL | Status: DC
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
## 2012-12-27 ENCOUNTER — Emergency Department (HOSPITAL_BASED_OUTPATIENT_CLINIC_OR_DEPARTMENT_OTHER)
Admission: EM | Admit: 2012-12-27 | Discharge: 2012-12-27 | Disposition: A | Discharge status: Home / Self Care | Payer: Self-pay | Attending: Emergency Medicine | Admitting: Emergency Medicine

## 2012-12-27 ENCOUNTER — Encounter (HOSPITAL_BASED_OUTPATIENT_CLINIC_OR_DEPARTMENT_OTHER): Payer: Self-pay

## 2012-12-27 ENCOUNTER — Emergency Department (HOSPITAL_BASED_OUTPATIENT_CLINIC_OR_DEPARTMENT_OTHER): Payer: Self-pay

## 2012-12-27 DIAGNOSIS — S93402A Sprain of unspecified ligament of left ankle, initial encounter: Secondary | ICD-10-CM

## 2012-12-27 DIAGNOSIS — Y9289 Other specified places as the place of occurrence of the external cause: Secondary | ICD-10-CM | POA: Insufficient documentation

## 2012-12-27 DIAGNOSIS — S8000XA Contusion of unspecified knee, initial encounter: Secondary | ICD-10-CM | POA: Insufficient documentation

## 2012-12-27 DIAGNOSIS — S93409A Sprain of unspecified ligament of unspecified ankle, initial encounter: Secondary | ICD-10-CM | POA: Insufficient documentation

## 2012-12-27 DIAGNOSIS — Y9389 Activity, other specified: Secondary | ICD-10-CM | POA: Insufficient documentation

## 2012-12-27 DIAGNOSIS — S80212A Abrasion, left knee, initial encounter: Secondary | ICD-10-CM

## 2012-12-27 DIAGNOSIS — IMO0002 Reserved for concepts with insufficient information to code with codable children: Secondary | ICD-10-CM | POA: Insufficient documentation

## 2012-12-27 DIAGNOSIS — Z8619 Personal history of other infectious and parasitic diseases: Secondary | ICD-10-CM | POA: Insufficient documentation

## 2012-12-27 DIAGNOSIS — S8002XA Contusion of left knee, initial encounter: Secondary | ICD-10-CM

## 2012-12-27 DIAGNOSIS — R011 Cardiac murmur, unspecified: Secondary | ICD-10-CM | POA: Insufficient documentation

## 2012-12-27 DIAGNOSIS — X500XXA Overexertion from strenuous movement or load, initial encounter: Secondary | ICD-10-CM | POA: Insufficient documentation

## 2012-12-27 DIAGNOSIS — Z8679 Personal history of other diseases of the circulatory system: Secondary | ICD-10-CM | POA: Insufficient documentation

## 2012-12-27 DIAGNOSIS — W010XXA Fall on same level from slipping, tripping and stumbling without subsequent striking against object, initial encounter: Secondary | ICD-10-CM | POA: Insufficient documentation

## 2012-12-27 MED ORDER — HYDROCODONE-ACETAMINOPHEN 5-325 MG PO TABS: 2.0000 | ORAL_TABLET | Freq: Four times a day (QID) | ORAL | Status: DC | PRN

## 2012-12-27 NOTE — ED Provider Notes (Signed)
CSN: [id number]     Arrival date & time 12/27/12  1650 History   First MD Initiated Contact with Patient 12/27/12 1652     Chief Complaint  Patient presents with  . Knee Pain  . Foot Pain  . Ankle Pain   (Consider location/radiation/quality/duration/timing/severity/associated sxs/prior Treatment) Patient is a 22 y.o. female presenting with knee pain, lower extremity pain, and ankle pain.  Knee Pain Foot Pain  Ankle Pain  Pt reports she slipped and fell about 3 days ago while out of town, landing on her L knee and twisting her L ankle/foot. She had some pain initially but it has been worsening over the interim to the point where she cannot bear weight well. Denies head injury or LOC.  Past Medical History  Diagnosis Date  . History of chicken pox   . Irregular heart beat   . Heart murmur     since birth  . Migraine    Past Surgical History  Procedure Laterality Date  . No past surgeries     Family History  Problem Relation Age of Onset  . Hypertension Mother   . Crohn's disease Mother   . Diabetes Father   . Asthma Sister   . Heart disease Neg Hx   . Kidney disease Neg Hx    History  Substance Use Topics  . Smoking status: Never Smoker   . Smokeless tobacco: Never Used  . Alcohol Use: No   OB History   Grav Para Term Preterm Abortions TAB SAB Ect Mult Living                 Review of Systems All other systems reviewed and are negative except as noted in HPI.   Allergies  Review of patient's allergies indicates no known allergies.  Home Medications  No current outpatient prescriptions on file. LMP 11/20/2012 Physical Exam  Nursing note and vitals reviewed. Constitutional: She is oriented to person, place, and time. She appears well-developed and well-nourished.  HENT:  Head: Normocephalic and atraumatic.  Eyes: EOM are normal. Pupils are equal, round, and reactive to light.  Neck: Normal range of motion. Neck supple.  Cardiovascular: Normal rate, normal  heart sounds and intact distal pulses.   Pulmonary/Chest: Effort normal and breath sounds normal.  Abdominal: Bowel sounds are normal. She exhibits no distension. There is no tenderness.  Musculoskeletal: She exhibits tenderness. She exhibits no edema.  3cm superficial abrasion anterior L knee, tender to palpation, no instability; tender over the L lateral maleolus and L anterior and lateral foot. No deformity, mild swelling  Neurological: She is alert and oriented to person, place, and time. She has normal strength. No cranial nerve deficit or sensory deficit.  Skin: Skin is warm and dry. No rash noted.  Psychiatric: She has a normal mood and affect.    ED Course  Procedures (including critical care time) Labs Review Labs Reviewed - No data to display Imaging Review Dg Ankle Complete Left  12/27/2012   CLINICAL DATA:  Fall, ankle pain.  EXAM: LEFT ANKLE COMPLETE - 3+ VIEW  COMPARISON:  None.  FINDINGS: There is no evidence of fracture, dislocation, or joint effusion. There is no evidence of arthropathy or other focal bone abnormality. Soft tissues are unremarkable.  IMPRESSION: Negative.   Electronically Signed   By: Charlett Nose M.D.   On: 12/27/2012 17:48   Dg Knee Complete 4 Views Left  12/27/2012   CLINICAL DATA:  Knee pain. Fall.  EXAM: LEFT KNEE -  COMPLETE 4+ VIEW  COMPARISON:  None.  FINDINGS: No acute bony abnormality. Specifically, no fracture, subluxation, or dislocation. Soft tissues are intact. Joint spaces are maintained. Normal bone mineralization.  IMPRESSION: Negative.   Electronically Signed   By: Charlett Nose M.D.   On: 12/27/2012 17:47   Dg Foot Complete Left  12/27/2012   CLINICAL DATA:  Fall, pain.  EXAM: LEFT FOOT - COMPLETE 3+ VIEW  COMPARISON:  None.  FINDINGS: There is no evidence of fracture or dislocation. There is no evidence of arthropathy or other focal bone abnormality. Soft tissues are unremarkable.  IMPRESSION: Negative.   Electronically Signed   By: Charlett Nose M.D.   On: 12/27/2012 17:48    MDM   1. Knee contusion, left, initial encounter   2. Knee abrasion, left, initial encounter   3. Ankle sprain, left, initial encounter     Imaging reviewed and negative. Non-stick dressing, ACE wrap, ankle brace. Pain meds as needed, PCP followup.     Charles B. Bernette Mayers, MD 12/27/12 1806

## 2012-12-27 NOTE — ED Notes (Signed)
Pt reports tripping and falling in a parking lot on Sunday and now has left knee, ankle and foot pain.

## 2013-02-05 ENCOUNTER — Encounter: Payer: Self-pay | Admitting: Family

## 2013-02-14 ENCOUNTER — Ambulatory Visit (HOSPITAL_BASED_OUTPATIENT_CLINIC_OR_DEPARTMENT_OTHER)
Admission: RE | Admit: 2013-02-14 | Discharge: 2013-02-14 | Disposition: A | Discharge status: Home / Self Care | Payer: Self-pay | Source: Ambulatory Visit | Attending: Family | Admitting: Family

## 2013-02-14 ENCOUNTER — Encounter: Payer: Self-pay | Admitting: Family

## 2013-02-14 ENCOUNTER — Ambulatory Visit (INDEPENDENT_AMBULATORY_CARE_PROVIDER_SITE_OTHER): Payer: Self-pay | Admitting: Family

## 2013-02-14 VITALS — BP 110/80 | HR 83 | Temp 98.7°F | Resp 16 | Ht 61.5 in | Wt 169.0 lb

## 2013-02-14 DIAGNOSIS — M79641 Pain in right hand: Secondary | ICD-10-CM

## 2013-02-14 DIAGNOSIS — M79609 Pain in unspecified limb: Secondary | ICD-10-CM | POA: Insufficient documentation

## 2013-02-14 DIAGNOSIS — W19XXXA Unspecified fall, initial encounter: Secondary | ICD-10-CM | POA: Insufficient documentation

## 2013-02-14 DIAGNOSIS — M25539 Pain in unspecified wrist: Secondary | ICD-10-CM | POA: Insufficient documentation

## 2013-02-14 DIAGNOSIS — Z0000 Encounter for general adult medical examination without abnormal findings: Secondary | ICD-10-CM

## 2013-02-14 DIAGNOSIS — Z23 Encounter for immunization: Secondary | ICD-10-CM

## 2013-02-14 DIAGNOSIS — M79643 Pain in unspecified hand: Secondary | ICD-10-CM | POA: Insufficient documentation

## 2013-02-14 DIAGNOSIS — G43909 Migraine, unspecified, not intractable, without status migrainosus: Secondary | ICD-10-CM

## 2013-02-14 DIAGNOSIS — Z111 Encounter for screening for respiratory tuberculosis: Secondary | ICD-10-CM

## 2013-02-14 LAB — BASIC METABOLIC PANEL WITH GFR
BUN: 12 mg/dL (ref 6–23)
Calcium: 9.8 mg/dL (ref 8.4–10.5)
Creat: 0.74 mg/dL (ref 0.50–1.10)
GFR, Est African American: >89 mL/min
GFR, Est Non African American: >89 mL/min
Glucose, Bld: 89 mg/dL (ref 70–99)
Potassium: 4.2 mEq/L (ref 3.5–5.3)

## 2013-02-14 LAB — CBC WITH DIFFERENTIAL/PLATELET
Eosinophils Absolute: 0.1 10*3/uL (ref 0.0–0.7)
Hemoglobin: 14.1 g/dL (ref 12.0–15.0)
Lymphs Abs: 2.2 10*3/uL (ref 0.7–4.0)
Monocytes Relative: 8 % (ref 3–12)
Neutro Abs: 3.1 10*3/uL (ref 1.7–7.7)
Neutrophils Relative %: 51 % (ref 43–77)
Platelets: 321 10*3/uL (ref 150–400)
RBC: 5.24 MIL/uL — ABNORMAL HIGH (ref 3.87–5.11)
WBC: 5.9 10*3/uL (ref 4.0–10.5)

## 2013-02-14 LAB — LIPID PANEL
Cholesterol: 188 mg/dL (ref 0–200)
Total CHOL/HDL Ratio: 2.7 Ratio
Triglycerides: 70 mg/dL (ref <150)
VLDL: 14 mg/dL (ref 0–40)

## 2013-02-14 LAB — HEPATIC FUNCTION PANEL
ALT: 14 U/L (ref 0–35)
Albumin: 4.8 g/dL (ref 3.5–5.2)
Total Protein: 7.9 g/dL (ref 6.0–8.3)

## 2013-02-14 MED ORDER — SUMATRIPTAN SUCCINATE 50 MG PO TABS: ORAL_TABLET | ORAL | Status: DC

## 2013-02-14 NOTE — Assessment & Plan Note (Addendum)
New, Status post fall.  Right wrist and hand xrays obtained.  Pt given right wrist brace to wear for next few weeks along with prn ibuprofen.  If no improvement consider referral to ortho.

## 2013-02-14 NOTE — Assessment & Plan Note (Signed)
Patient counseled on diet and exercise.  Flu shot today.  Pap and Tdap up to date.

## 2013-02-14 NOTE — Patient Instructions (Addendum)
Please complete your lab work prior to leaving. You may use ibuprofen as needed for pain.   Complete your x rays on the first floor. We will contact you with your results. Follow up in 1 month so we can see how your wrist and migraines are doing.  Follow up on Friday for PPD reading.

## 2013-02-14 NOTE — Progress Notes (Addendum)
Subjective:    Patient ID: Erika Woodard, female    DOB: Jun 13, 1990, 22 y.o.   MRN: [id number]  HPI [name] is a 22 y/o female who presents today for physical.  Diet) Patient reports minimal fruits and vegetables weekly except for daily smoothies.  Patient reports eating junk food.  Exercise)  Patient has not been exercising since her knee injury one month ago.  Immunizations) Flu shot today, tetanus up to date  Pap) Last Pap September 2013  Migraine) Patient reports migraine headaches have returned, have been absent over the past month.  Patient reports a flushing sensation prior to migraines, migraines last about 2 days. OTC tylenol and ibuprofen do not help.  Arm Pain) Patient reports right arm, shooting pain from right wrist up to right elbow and right shoulder x 1 month. Patient fell one month ago.   Review of Systems  Constitutional: Negative for fever and chills.  HENT: Negative for sore throat.   Eyes: Negative for redness and itching.  Respiratory: Negative for cough and shortness of breath.   Cardiovascular: Negative for chest pain.  Gastrointestinal: Negative for nausea, vomiting and abdominal pain.       Patient reports nausea with migraines.  Endocrine: Negative for polyuria.  Genitourinary: Negative for dysuria and menstrual problem.  Musculoskeletal: Negative for arthralgias and myalgias.  Allergic/Immunologic: Negative for environmental allergies.  Neurological: Positive for headaches.       Reports Migraines x 1 month. Has history of the same.  Psychiatric/Behavioral: Positive for sleep disturbance. The patient is not nervous/anxious.        Patient reports waking up at night several times a week.   Past Medical History  Diagnosis Date  . History of chicken pox   . Irregular heart beat   . Heart murmur     since birth  . Migraine     History   Social History  . Marital Status: Single    Spouse Name: N/A    Number of Children: 0  . Years of  Education: N/A   Occupational History  . Not on file.   Social History Main Topics  . Smoking status: Never Smoker   . Smokeless tobacco: Never Used  . Alcohol Use: No  . Drug Use: No  . Sexual Activity: Not on file   Other Topics Concern  . Not on file   Social History Narrative   Regular exercise: no   Caffeine use: 1-2 daily   Associates degree    Works in Clinical biochemist at [employer]             Past Surgical History  Procedure Laterality Date  . No past surgeries      Family History  Problem Relation Age of Onset  . Hypertension Mother   . Crohn's disease Mother   . Diabetes Father   . Asthma Sister   . Heart disease Neg Hx   . Kidney disease Neg Hx     No Known Allergies  Current Outpatient Prescriptions on File Prior to Visit  Medication Sig Dispense Refill  . HYDROcodone-acetaminophen (NORCO/VICODIN) 5-325 MG per tablet Take 2 tablets by mouth every 6 (six) hours as needed for pain.  20 tablet  0   No current facility-administered medications on file prior to visit.    BP 110/80  Pulse 83  Temp(Src) 98.7 F (37.1 C) (Oral)  Resp 16  Ht 5' 1.5" (1.562 m)  Wt 169 lb (76.658 kg)  BMI 31.42 kg/m2  SpO2 99%  LMP 01/27/2013       Objective:   Physical Exam  Constitutional: She is oriented to person, place, and time. She appears well-nourished.  HENT:  Head: Normocephalic.  Right Ear: External ear normal.  Left Ear: External ear normal.  Nose: Nose normal.  Mouth/Throat: Oropharynx is clear and moist. No oropharyngeal exudate.  Eyes: Pupils are equal, round, and reactive to light.  Neck: Neck supple.  Cardiovascular: Normal rate, regular rhythm, normal heart sounds and intact distal pulses.   No murmur heard. Pulmonary/Chest: Effort normal and breath sounds normal. No respiratory distress. She has no wheezes.  Abdominal: Soft. Bowel sounds are normal. She exhibits no distension. There is no tenderness.  Musculoskeletal: Normal range of  motion.  Tenderness upon palpation to right anterior wrist.  Lymphadenopathy:    She has no cervical adenopathy.  Neurological: She is alert and oriented to person, place, and time. She has normal reflexes.  Skin: Skin is warm and dry. No rash noted.  Psychiatric: She has a normal mood and affect. Her behavior is normal.          Assessment & Plan:  Pt seen and examined. Agree with assessment and plan as outlined by Ms. Shir Bergman.

## 2013-02-14 NOTE — Assessment & Plan Note (Addendum)
Deteriorated.  Start Imitrex tablets as needed for migraines.  Follow up in 1 month.  Consider adding beta blocker for prophylaxis if no improvement next visit.

## 2013-02-15 LAB — RUBELLA SCREEN: Rubella: 3.74 Index — ABNORMAL HIGH (ref <0.90)

## 2013-02-15 LAB — URINALYSIS, ROUTINE W REFLEX MICROSCOPIC
Bilirubin Urine: NEGATIVE
Glucose, UA: NEGATIVE mg/dL
Hgb urine dipstick: NEGATIVE
Protein, ur: NEGATIVE mg/dL
pH: 6 (ref 5.0–8.0)

## 2013-02-15 LAB — URINALYSIS, MICROSCOPIC ONLY
Casts: NONE SEEN
Crystals: NONE SEEN

## 2013-02-15 LAB — MUMPS ANTIBODY, IGG: Mumps IgG: 191 AU/mL — ABNORMAL HIGH (ref <9.00)

## 2013-02-15 LAB — RUBEOLA ANTIBODY IGG: Rubeola IgG: >300 AU/mL — ABNORMAL HIGH (ref <25.00)

## 2013-02-15 LAB — HEPATITIS B SURFACE ANTIGEN: Hepatitis B Surface Ag: NEGATIVE

## 2013-02-15 LAB — HEPATITIS B SURFACE ANTIBODY,QUALITATIVE: Hep B S Ab: POSITIVE — AB

## 2013-02-16 LAB — TB SKIN TEST: TB Skin Test: NEGATIVE

## 2013-02-20 ENCOUNTER — Telehealth: Payer: Self-pay | Admitting: *Deleted

## 2013-02-20 NOTE — Telephone Encounter (Signed)
Physical Form completed for [employer]. Notified pt and placed original at front desk along with copy of immunization titers for her to pick up tomorrow. Copy sent for scanning.

## 2013-03-12 ENCOUNTER — Ambulatory Visit (INDEPENDENT_AMBULATORY_CARE_PROVIDER_SITE_OTHER): Payer: Self-pay | Admitting: Family

## 2013-03-12 ENCOUNTER — Encounter: Payer: Self-pay | Admitting: Family

## 2013-03-12 VITALS — BP 100/70 | HR 66 | Temp 98.7°F | Resp 16 | Ht 61.5 in | Wt 173.1 lb

## 2013-03-12 DIAGNOSIS — N926 Irregular menstruation, unspecified: Secondary | ICD-10-CM | POA: Insufficient documentation

## 2013-03-12 DIAGNOSIS — M79609 Pain in unspecified limb: Secondary | ICD-10-CM

## 2013-03-12 DIAGNOSIS — G43909 Migraine, unspecified, not intractable, without status migrainosus: Secondary | ICD-10-CM

## 2013-03-12 DIAGNOSIS — M79641 Pain in right hand: Secondary | ICD-10-CM

## 2013-03-12 MED ORDER — NORGESTIMATE-ETH ESTRADIOL 0.25-35 MG-MCG PO TABS: 1.0000 | ORAL_TABLET | Freq: Every day | ORAL | Status: DC

## 2013-03-12 NOTE — Assessment & Plan Note (Signed)
Unchanged.  Refer to sports medicine for evaluation of right hand and wrist. Xrays were negative for fracture/dislocation.

## 2013-03-12 NOTE — Assessment & Plan Note (Addendum)
Unchanged since last month.  Start birth control for menstrual irregularities and migraine prevention.  Follow up in 3 months.

## 2013-03-12 NOTE — Assessment & Plan Note (Signed)
Patient not taking current birth control due to cost.  Start Sprintec which is on [facility]'s $4 list for menstrual irregularity. Follow up in 3 months.

## 2013-03-12 NOTE — Progress Notes (Signed)
Pre visit review using our clinic review tool, if applicable. No additional management support is needed unless otherwise documented below in the visit note. 

## 2013-03-12 NOTE — Progress Notes (Signed)
Subjective:    Patient ID: Erika Woodard, female    DOB: 1991/02/01, 22 y.o.   MRN: [id number]  HPI Erika Woodard is a 22 year old female who presents today for follow up for migraines.    1)Migraine: Patient was started on Imitrex for migraines and reports she has not filled due to cost. Patient reports 3 migraines over the past month that last for about one day. Patient taking ibuprofen and tylenol with relief.   2)Hand pain: patient reports right hand continues to be painful since falling September 2014. Patient doing warm and cool compresses at night and takes ibuprofen without relief.   3)Menstrual irregularity: Patient reports increased menstrual cramps 1 week prior and 2 weeks after menstrual cycle with spotting during that time as well.  Patient stopped taking Loestrin several months ago due to cost.     Review of Systems  Constitutional: Positive for appetite change and fatigue.       Reports fatigue over past several weeks and reports she is eating less but continues to gain weight.  HENT: Negative for rhinorrhea and sore throat.   Respiratory: Negative for cough and shortness of breath.   Cardiovascular: Negative for chest pain.  Gastrointestinal: Negative for nausea and abdominal pain.  Endocrine: Negative for polyuria.  Genitourinary: Positive for vaginal bleeding and menstrual problem. Negative for dysuria and difficulty urinating.       See HPI  Neurological: Positive for headaches.   Past Medical History  Diagnosis Date  . History of chicken pox   . Irregular heart beat   . Heart murmur     since birth  . Migraine     History   Social History  . Marital Status: Single    Spouse Name: N/A    Number of Children: 0  . Years of Education: N/A   Occupational History  . Not on file.   Social History Main Topics  . Smoking status: Never Smoker   . Smokeless tobacco: Never Used  . Alcohol Use: No  . Drug Use: No  . Sexual Activity: Not on file   Other Topics  Concern  . Not on file   Social History Narrative   Regular exercise: no   Caffeine use: 1-2 daily   Associates degree    Works in Clinical biochemist at [employer]             Past Surgical History  Procedure Laterality Date  . No past surgeries      Family History  Problem Relation Age of Onset  . Hypertension Mother   . Crohn's disease Mother   . Diabetes Father   . Asthma Sister   . Heart disease Neg Hx   . Kidney disease Neg Hx     No Known Allergies  Current Outpatient Prescriptions on File Prior to Visit  Medication Sig Dispense Refill  . HYDROcodone-acetaminophen (NORCO/VICODIN) 5-325 MG per tablet Take 2 tablets by mouth every 6 (six) hours as needed for pain.  20 tablet  0   No current facility-administered medications on file prior to visit.    BP 100/70  Pulse 66  Temp(Src) 98.7 F (37.1 C) (Oral)  Resp 16  Ht 5' 1.5" (1.562 m)  Wt 173 lb 1.3 oz (78.509 kg)  BMI 32.18 kg/m2  SpO2 99%  LMP 02/24/2013       Objective:   Physical Exam  Constitutional: She is oriented to person, place, and time. She appears well-nourished.  Neck: Neck supple.  No thyromegaly present.  Cardiovascular: Normal rate and regular rhythm.   Pulmonary/Chest: Effort normal and breath sounds normal.  Abdominal: Soft. Bowel sounds are normal. She exhibits no distension. There is no tenderness.  Lymphadenopathy:    She has no cervical adenopathy.  Neurological: She is alert and oriented to person, place, and time.  Skin: Skin is warm and dry.  Psychiatric: She has a normal mood and affect.          Assessment & Plan:

## 2013-03-12 NOTE — Patient Instructions (Signed)
Please follow up in 3 months, sooner if problems/concerns. You will be contacted about your referral to sports medicine.

## 2013-03-12 NOTE — Addendum Note (Signed)
Addended by: Mervin Kung A on: 03/12/2013 01:26 PM   Modules accepted: Orders

## 2013-03-13 ENCOUNTER — Encounter: Payer: Self-pay | Admitting: Family Medicine

## 2013-03-13 ENCOUNTER — Ambulatory Visit (INDEPENDENT_AMBULATORY_CARE_PROVIDER_SITE_OTHER): Payer: Self-pay | Admitting: Family Medicine

## 2013-03-13 VITALS — BP 99/64 | HR 65 | Ht 61.0 in | Wt 173.0 lb

## 2013-03-13 DIAGNOSIS — M25531 Pain in right wrist: Secondary | ICD-10-CM

## 2013-03-13 DIAGNOSIS — M79641 Pain in right hand: Secondary | ICD-10-CM

## 2013-03-13 DIAGNOSIS — M79609 Pain in unspecified limb: Secondary | ICD-10-CM

## 2013-03-13 DIAGNOSIS — M25539 Pain in unspecified wrist: Secondary | ICD-10-CM

## 2013-03-13 MED ORDER — MELOXICAM 15 MG PO TABS: 15.0000 mg | ORAL_TABLET | Freq: Every day | ORAL | Status: DC

## 2013-03-13 NOTE — Patient Instructions (Signed)
Fill out cone coverage paperwork and call me when this goes through. The concern would be for an occult fracture (one that doesn't show up on x-rays) or scapholunate dissociation (tearing of a ligament in the wrist/hand). MRI would be the next step to assess for this. In meantime icing 15 minutes at a time 3-4 times a day. Wrist brace as often as possible. Meloxicam 15mg  daily with food for pain and inflammation (OR aleve 2 tabs twice a day with food). Follow up will depend on the mri results.

## 2013-03-18 ENCOUNTER — Encounter: Payer: Self-pay | Admitting: Family Medicine

## 2013-03-18 DIAGNOSIS — M25531 Pain in right wrist: Secondary | ICD-10-CM | POA: Insufficient documentation

## 2013-03-18 NOTE — Progress Notes (Signed)
Patient ID: Erika Woodard, female   DOB: July 04, 1990, 22 y.o.   MRN: [id number]  PCP: Lemont Fillers., NP  Subjective:   HPI: Patient is a 22 y.o. female here for right hand pain.  Patient reports pain started about 3 months ago. She fell in August - not sure if she put hand out to brace her fall or not. +swelling. Has struggles with dorsal and radial sided wrist pain since then. Tried icing, hot compresses, tylenol, ibuprofen, brace. 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  Past Medical History  Diagnosis Date  . History of chicken pox   . Irregular heart beat   . Heart murmur     since birth  . Migraine     Current Outpatient Prescriptions on File Prior to Visit  Medication Sig Dispense Refill  . norgestimate-ethinyl estradiol (ORTHO-CYCLEN,SPRINTEC,PREVIFEM) 0.25-35 MG-MCG tablet Take 1 tablet by mouth daily.  1 Package  6   No current facility-administered medications on file prior to visit.    Past Surgical History  Procedure Laterality Date  . No past surgeries      No Known Allergies  History   Social History  . Marital Status: Single    Spouse Name: N/A    Number of Children: 0  . Years of Education: N/A   Occupational History  . Not on file.   Social History Main Topics  . Smoking status: Never Smoker   . Smokeless tobacco: Never Used  . Alcohol Use: No  . Drug Use: No  . Sexual Activity: Not on file   Other Topics Concern  . Not on file   Social History Narrative   Regular exercise: no   Caffeine use: 1-2 daily   Associates degree    Works in Clinical biochemist at [employer]             Family History  Problem Relation Age of Onset  . Hypertension Mother   . Crohn's disease Mother   . Diabetes Father   . Hyperlipidemia Father   . Asthma Sister   . Heart disease Neg Hx   . Kidney disease Neg Hx     BP 99/64  Pulse 65  Ht 5\' 1"  (1.549 m)  Wt 173 lb (78.472 kg)  BMI 32.70 kg/m2  LMP  02/24/2013  Review of Systems: See HPI above.    Objective:  Physical Exam:  Gen: NAD  R hand/wrist: Mild swelling dorsally.  No bruising, other deformity. TTP distal radius, area of scapholunate ligament.  No other TTP wrist, hand. FROM wrist and digits, pain on full flexion and extension. NVI distally.    Assessment & Plan:  1. Right wrist pain - due to fall sustained 3 months ago.  Concern for occult fracture that has not healed (scaphoid, chauffeurs fracture) or scapholunate dissociation.  Wrist brace regularly.  Icing, mobic.  To fill out cone coverage - would move forward with MRI once this goes through.

## 2013-03-18 NOTE — Assessment & Plan Note (Signed)
due to fall sustained 3 months ago.  Concern for occult fracture that has not healed (scaphoid, chauffeurs fracture) or scapholunate dissociation.  Wrist brace regularly.  Icing, mobic.  To fill out cone coverage - would move forward with MRI once this goes through.

## 2013-03-23 ENCOUNTER — Encounter: Payer: Self-pay | Admitting: *Deleted

## 2013-04-20 ENCOUNTER — Emergency Department (HOSPITAL_BASED_OUTPATIENT_CLINIC_OR_DEPARTMENT_OTHER): Payer: Self-pay

## 2013-04-20 ENCOUNTER — Emergency Department (HOSPITAL_BASED_OUTPATIENT_CLINIC_OR_DEPARTMENT_OTHER)
Admission: EM | Admit: 2013-04-20 | Discharge: 2013-04-20 | Disposition: A | Discharge status: Home / Self Care | Payer: Self-pay | Attending: Emergency Medicine | Admitting: Emergency Medicine

## 2013-04-20 ENCOUNTER — Encounter (HOSPITAL_BASED_OUTPATIENT_CLINIC_OR_DEPARTMENT_OTHER): Payer: Self-pay | Admitting: Emergency Medicine

## 2013-04-20 DIAGNOSIS — R011 Cardiac murmur, unspecified: Secondary | ICD-10-CM | POA: Insufficient documentation

## 2013-04-20 DIAGNOSIS — Z8619 Personal history of other infectious and parasitic diseases: Secondary | ICD-10-CM | POA: Insufficient documentation

## 2013-04-20 DIAGNOSIS — Z791 Long term (current) use of non-steroidal anti-inflammatories (NSAID): Secondary | ICD-10-CM | POA: Insufficient documentation

## 2013-04-20 DIAGNOSIS — R0602 Shortness of breath: Secondary | ICD-10-CM | POA: Insufficient documentation

## 2013-04-20 DIAGNOSIS — G43909 Migraine, unspecified, not intractable, without status migrainosus: Secondary | ICD-10-CM | POA: Insufficient documentation

## 2013-04-20 DIAGNOSIS — R079 Chest pain, unspecified: Secondary | ICD-10-CM | POA: Insufficient documentation

## 2013-04-20 DIAGNOSIS — Z79899 Other long term (current) drug therapy: Secondary | ICD-10-CM | POA: Insufficient documentation

## 2013-04-20 DIAGNOSIS — N390 Urinary tract infection, site not specified: Secondary | ICD-10-CM | POA: Insufficient documentation

## 2013-04-20 DIAGNOSIS — Z8679 Personal history of other diseases of the circulatory system: Secondary | ICD-10-CM | POA: Insufficient documentation

## 2013-04-20 DIAGNOSIS — Z3202 Encounter for pregnancy test, result negative: Secondary | ICD-10-CM | POA: Insufficient documentation

## 2013-04-20 LAB — BASIC METABOLIC PANEL
BUN: 11 mg/dL (ref 6–23)
CALCIUM: 8.8 mg/dL (ref 8.4–10.5)
CO2: 25 mEq/L (ref 19–32)
CREATININE: 0.7 mg/dL (ref 0.50–1.10)
Chloride: 105 mEq/L (ref 96–112)
Glucose, Bld: 108 mg/dL — ABNORMAL HIGH (ref 70–99)
Potassium: 4.2 mEq/L (ref 3.7–5.3)
SODIUM: 143 meq/L (ref 137–147)

## 2013-04-20 LAB — URINE MICROSCOPIC-ADD ON

## 2013-04-20 LAB — CBC WITH DIFFERENTIAL/PLATELET
BASOS ABS: 0 10*3/uL (ref 0.0–0.1)
BASOS PCT: 1 % (ref 0–1)
EOS ABS: 0.1 10*3/uL (ref 0.0–0.7)
EOS PCT: 2 % (ref 0–5)
HEMATOCRIT: 37.9 % (ref 36.0–46.0)
Hemoglobin: 12.9 g/dL (ref 12.0–15.0)
Lymphocytes Relative: 47 % — ABNORMAL HIGH (ref 12–46)
Lymphs Abs: 3 10*3/uL (ref 0.7–4.0)
MCH: 27.2 pg (ref 26.0–34.0)
MCHC: 34 g/dL (ref 30.0–36.0)
MCV: 80 fL (ref 78.0–100.0)
MONO ABS: 0.6 10*3/uL (ref 0.1–1.0)
Monocytes Relative: 10 % (ref 3–12)
Neutro Abs: 2.6 10*3/uL (ref 1.7–7.7)
Neutrophils Relative %: 41 % — ABNORMAL LOW (ref 43–77)
Platelets: 350 10*3/uL (ref 150–400)
RBC: 4.74 MIL/uL (ref 3.87–5.11)
RDW: 14 % (ref 11.5–15.5)
WBC: 6.4 10*3/uL (ref 4.0–10.5)

## 2013-04-20 LAB — URINALYSIS, ROUTINE W REFLEX MICROSCOPIC
Bilirubin Urine: NEGATIVE
Glucose, UA: NEGATIVE mg/dL
Hgb urine dipstick: NEGATIVE
KETONES UR: NEGATIVE mg/dL
NITRITE: NEGATIVE
PROTEIN: NEGATIVE mg/dL
Specific Gravity, Urine: 1.022 (ref 1.005–1.030)
UROBILINOGEN UA: 1 mg/dL (ref 0.0–1.0)
pH: 7 (ref 5.0–8.0)

## 2013-04-20 LAB — TROPONIN I

## 2013-04-20 LAB — PREGNANCY, URINE: Preg Test, Ur: NEGATIVE

## 2013-04-20 LAB — D-DIMER, QUANTITATIVE (NOT AT ARMC): D DIMER QUANT: 0.54 ug{FEU}/mL — AB (ref 0.00–0.48)

## 2013-04-20 MED ORDER — IOHEXOL 350 MG/ML SOLN
100.0000 mL | Freq: Once | INTRAVENOUS | Status: AC | PRN
  Administered 2013-04-20: 100 mL via INTRAVENOUS

## 2013-04-20 MED ORDER — DIPHENHYDRAMINE HCL 50 MG/ML IJ SOLN
12.5000 mg | Freq: Once | INTRAMUSCULAR | Status: AC
  Administered 2013-04-20: 12.5 mg via INTRAVENOUS
  Filled 2013-04-20: qty 1

## 2013-04-20 MED ORDER — KETOROLAC TROMETHAMINE 30 MG/ML IJ SOLN
30.0000 mg | Freq: Once | INTRAMUSCULAR | Status: AC
  Administered 2013-04-20: 30 mg via INTRAVENOUS
  Filled 2013-04-20: qty 1

## 2013-04-20 MED ORDER — DEXAMETHASONE SODIUM PHOSPHATE 10 MG/ML IJ SOLN
10.0000 mg | Freq: Once | INTRAMUSCULAR | Status: AC
  Administered 2013-04-20: 10 mg via INTRAVENOUS
  Filled 2013-04-20: qty 1

## 2013-04-20 MED ORDER — SODIUM CHLORIDE 0.9 % IV BOLUS (SEPSIS)
1000.0000 mL | Freq: Once | INTRAVENOUS | Status: AC
  Administered 2013-04-20: 1000 mL via INTRAVENOUS

## 2013-04-20 MED ORDER — SUMATRIPTAN SUCCINATE 6 MG/0.5ML ~~LOC~~ SOLN
6.0000 mg | Freq: Once | SUBCUTANEOUS | Status: AC
  Administered 2013-04-20: 6 mg via SUBCUTANEOUS
  Filled 2013-04-20: qty 0.5

## 2013-04-20 MED ORDER — IOHEXOL 300 MG/ML  SOLN: 100.0000 mL | Freq: Once | INTRAMUSCULAR | Status: DC | PRN

## 2013-04-20 MED ORDER — SULFAMETHOXAZOLE-TRIMETHOPRIM 800-160 MG PO TABS: 1.0000 | ORAL_TABLET | Freq: Two times a day (BID) | ORAL | Status: AC

## 2013-04-20 MED ORDER — SUMATRIPTAN SUCCINATE 100 MG PO TABS: 100.0000 mg | ORAL_TABLET | ORAL | Status: DC | PRN

## 2013-04-20 MED ORDER — METOCLOPRAMIDE HCL 5 MG/ML IJ SOLN
10.0000 mg | Freq: Once | INTRAMUSCULAR | Status: AC
  Administered 2013-04-20: 10 mg via INTRAVENOUS
  Filled 2013-04-20: qty 2

## 2013-04-20 NOTE — Discharge Instructions (Signed)
Your labs today were reassuring. There is no signs of damage to your heart. Your EKG was normal. Your chest CT showed no blood clots in your lungs, infection or fluid on your lungs. You do have urinary tract infection. We are sending you home with prescription for Bactrim. Please take 1 tablet a day for one week. We also are sending with prescription for Imitrex for your migraine headaches. You may find coupons for your prescriptions on [email or web address].  If you develop any worsening chest pain or shortness of breath, numbness or weakness on one side your body, worsening headache or vision changes, fever or neck pain or neck stiffness, please return to the emergency department.  Chest Pain (Nonspecific) It is often hard to give a specific diagnosis for the cause of chest pain. There is always a chance that your pain could be related to something serious, such as a heart attack or a blood clot in the lungs. You need to follow up with your caregiver for further evaluation. CAUSES   Heartburn.  Pneumonia or bronchitis.  Anxiety or stress.  Inflammation around your heart (pericarditis) or lung (pleuritis or pleurisy).  A blood clot in the lung.  A collapsed lung (pneumothorax). It can develop suddenly on its own (spontaneous pneumothorax) or from injury (trauma) to the chest.  Shingles infection (herpes zoster virus). The chest wall is composed of bones, muscles, and cartilage. Any of these can be the source of the pain.  The bones can be bruised by injury.  The muscles or cartilage can be strained by coughing or overwork.  The cartilage can be affected by inflammation and become sore (costochondritis). DIAGNOSIS  Lab tests or other studies, such as X-rays, electrocardiography, stress testing, or cardiac imaging, may be needed to find the cause of your pain.  TREATMENT   Treatment depends on what may be causing your chest pain. Treatment may include:  Acid blockers for  heartburn.  Anti-inflammatory medicine.  Pain medicine for inflammatory conditions.  Antibiotics if an infection is present.  You may be advised to change lifestyle habits. This includes stopping smoking and avoiding alcohol, caffeine, and chocolate.  You may be advised to keep your head raised (elevated) when sleeping. This reduces the chance of acid going backward from your stomach into your esophagus.  Most of the time, nonspecific chest pain will improve within 2 to 3 days with rest and mild pain medicine. HOME CARE INSTRUCTIONS   If antibiotics were prescribed, take your antibiotics as directed. Finish them even if you start to feel better.  For the next few days, avoid physical activities that bring on chest pain. Continue physical activities as directed.  Do not smoke.  Avoid drinking alcohol.  Only take over-the-counter or prescription medicine for pain, discomfort, or fever as directed by your caregiver.  Follow your caregiver's suggestions for further testing if your chest pain does not go away.  Keep any follow-up appointments you made. If you do not go to an appointment, you could develop lasting (chronic) problems with pain. If there is any problem keeping an appointment, you must call to reschedule. SEEK MEDICAL CARE IF:   You think you are having problems from the medicine you are taking. Read your medicine instructions carefully.  Your chest pain does not go away, even after treatment.  You develop a rash with blisters on your chest. SEEK IMMEDIATE MEDICAL CARE IF:   You have increased chest pain or pain that spreads to your arm, neck, jaw, back, or  abdomen.  You develop shortness of breath, an increasing cough, or you are coughing up blood.  You have severe back or abdominal pain, feel nauseous, or vomit.  You develop severe weakness, fainting, or chills.  You have a fever. THIS IS AN EMERGENCY. Do not wait to see if the pain will go away. Get medical  help at once. Call your local emergency services (911 in [place] Do not drive yourself to the hospital. MAKE SURE YOU:   Understand these instructions.  Will watch your condition.  Will get help right away if you are not doing well or get worse. Document Released: 01/13/2005 Document Revised: 06/28/2011 Document Reviewed: 11/09/2007 [facility] Patient Information 2014 [place], [place].  Migraine Headache A migraine headache is an intense, throbbing pain on one or both sides of your head. A migraine can last for 30 minutes to several hours. CAUSES  The exact cause of a migraine headache is not always known. However, a migraine may be caused when nerves in the brain become irritated and release chemicals that cause inflammation. This causes pain. SYMPTOMS  Pain on one or both sides of your head.  Pulsating or throbbing pain.  Severe pain that prevents daily activities.  Pain that is aggravated by any physical activity.  Nausea, vomiting, or both.  Dizziness.  Pain with exposure to bright lights, loud noises, or activity.  General sensitivity to bright lights, loud noises, or smells. Before you get a migraine, you may get warning signs that a migraine is coming (aura). An aura may include:  Seeing flashing lights.  Seeing bright spots, halos, or zig-zag lines.  Having tunnel vision or blurred vision.  Having feelings of numbness or tingling.  Having trouble talking.  Having muscle weakness. MIGRAINE TRIGGERS  Alcohol.  Smoking.  Stress.  Menstruation.  Aged cheeses.  Foods or drinks that contain nitrates, glutamate, aspartame, or tyramine.  Lack of sleep.  Chocolate.  Caffeine.  Hunger.  Physical exertion.  Fatigue.  Medicines used to treat chest pain (nitroglycerine), birth control pills, estrogen, and some blood pressure medicines. DIAGNOSIS  A migraine headache is often diagnosed based on:  Symptoms.  Physical examination.  A CT scan or MRI  of your head. TREATMENT Medicines may be given for pain and nausea. Medicines can also be given to help prevent recurrent migraines.  HOME CARE INSTRUCTIONS  Only take over-the-counter or prescription medicines for pain or discomfort as directed by your caregiver. The use of long-term narcotics is not recommended.  Lie down in a dark, quiet room when you have a migraine.  Keep a journal to find out what may trigger your migraine headaches. For example, write down:  What you eat and drink.  How much sleep you get.  Any change to your diet or medicines.  Limit alcohol consumption.  Quit smoking if you smoke.  Get 7 to 9 hours of sleep, or as recommended by your caregiver.  Limit stress.  Keep lights dim if bright lights bother you and make your migraines worse. SEEK IMMEDIATE MEDICAL CARE IF:   Your migraine becomes severe.  You have a fever.  You have a stiff neck.  You have vision loss.  You have muscular weakness or loss of muscle control.  You start losing your balance or have trouble walking.  You feel faint or pass out.  You have severe symptoms that are different from your first symptoms. MAKE SURE YOU:   Understand these instructions.  Will watch your condition.  Will get help  right away if you are not doing well or get worse. Document Released: 04/05/2005 Document Revised: 06/28/2011 Document Reviewed: 03/26/2011 [facility] Patient Information 2014 [place], [place].  Urinary Tract Infection Urinary tract infections (UTIs) can develop anywhere along your urinary tract. Your urinary tract is your body's drainage system for removing wastes and extra water. Your urinary tract includes two kidneys, two ureters, a bladder, and a urethra. Your kidneys are a pair of bean-shaped organs. Each kidney is about the size of your fist. They are located below your ribs, one on each side of your spine. CAUSES Infections are caused by microbes, which are microscopic  organisms, including fungi, viruses, and bacteria. These organisms are so small that they can only be seen through a microscope. Bacteria are the microbes that most commonly cause UTIs. SYMPTOMS  Symptoms of UTIs may vary by age and gender of the patient and by the location of the infection. Symptoms in young women typically include a frequent and intense urge to urinate and a painful, burning feeling in the bladder or urethra during urination. Older women and men are more likely to be tired, shaky, and weak and have muscle aches and abdominal pain. A fever may mean the infection is in your kidneys. Other symptoms of a kidney infection include pain in your back or sides below the ribs, nausea, and vomiting. DIAGNOSIS To diagnose a UTI, your caregiver will ask you about your symptoms. Your caregiver also will ask to provide a urine sample. The urine sample will be tested for bacteria and white blood cells. White blood cells are made by your body to help fight infection. TREATMENT  Typically, UTIs can be treated with medication. Because most UTIs are caused by a bacterial infection, they usually can be treated with the use of antibiotics. The choice of antibiotic and length of treatment depend on your symptoms and the type of bacteria causing your infection. HOME CARE INSTRUCTIONS  If you were prescribed antibiotics, take them exactly as your caregiver instructs you. Finish the medication even if you feel better after you have only taken some of the medication.  Drink enough water and fluids to keep your urine clear or pale yellow.  Avoid caffeine, tea, and carbonated beverages. They tend to irritate your bladder.  Empty your bladder often. Avoid holding urine for long periods of time.  Empty your bladder before and after sexual intercourse.  After a bowel movement, women should cleanse from front to back. Use each tissue only once. SEEK MEDICAL CARE IF:   You have back pain.  You develop a  fever.  Your symptoms do not begin to resolve within 3 days. SEEK IMMEDIATE MEDICAL CARE IF:   You have severe back pain or lower abdominal pain.  You develop chills.  You have nausea or vomiting.  You have continued burning or discomfort with urination. MAKE SURE YOU:   Understand these instructions.  Will watch your condition.  Will get help right away if you are not doing well or get worse. Document Released: 01/13/2005 Document Revised: 10/05/2011 Document Reviewed: 05/14/2011 [facility] Patient Information 2014 [place], [place].

## 2013-04-20 NOTE — ED Notes (Signed)
Lower back pain for a week. Headache x 3 days. Dysuria.

## 2013-04-20 NOTE — ED Provider Notes (Signed)
TIME SEEN: 7:22 PM  CHIEF COMPLAINT: Headache  HPI:  Erika Woodard is a 23 y.o. female with a h/o migraines who presents to the Emergency Department complaining of frontal headache for the past 3 days. She states light and sound make it worse. Sleep makes it better. She states it feels the same as migraines she has had in the past. She was recently prescribed medication for migraines but could not get it filled because of the cost.   She also reports intermittent CP and SOB for the past 3 years. She reports getting SOB walking here from the parking lot. She describes the CP as feeling like something is sitting on her chest and twisting. She reports this is the third year in a row around this time -beginning of January that she experienced similar symptoms. The last time she had blood work was one month ago. She denies fever, chills, cough, vomiting, numbness or tingling, focal weakness, bowel or bladder incontinence. She reports one episode diarrhea a few days ago. She denies h/o DVT or PE. She denies recent surgeries, recent prolonged immobilization such as hospitalization or long flight, trauma, lower extremity swelling or pain. No sick contacts. She is on [facility] pills. Denies any tobacco use. She denies family h/o CAD.  She also reports dysuria and lower back pain for the last week. No vaginal bleeding or discharge. LMP was several weeks ago.  ROS: See HPI Constitutional: no fever  Eyes: no drainage  ENT: no runny nose   Cardiovascular:  chest pain  Resp: SOB  GI: no vomiting GU: dysuria Integumentary: no rash  Allergy: no hives  Musculoskeletal: no leg swelling  Neurological: no slurred speech ROS otherwise negative  PAST MEDICAL HISTORY/PAST SURGICAL HISTORY:  Past Medical History  Diagnosis Date  . History of chicken pox   . Irregular heart beat   . Heart murmur     since birth  . Migraine     MEDICATIONS:  Prior to Admission medications   Medication Sig Start Date End Date  Taking? Authorizing Provider  meloxicam (MOBIC) 15 MG tablet Take 1 tablet (15 mg total) by mouth daily. With food. 03/13/13   Lenda KelpShane R Hudnall, MD  norgestimate-ethinyl estradiol (ORTHO-CYCLEN,SPRINTEC,PREVIFEM) 0.25-35 MG-MCG tablet Take 1 tablet by mouth daily. 03/12/13   Sandford CrazeMelissa O'Sullivan, NP    ALLERGIES:  No Known Allergies  SOCIAL HISTORY:  History  Substance Use Topics  . Smoking status: Never Smoker   . Smokeless tobacco: Never Used  . Alcohol Use: No    FAMILY HISTORY: Family History  Problem Relation Age of Onset  . Hypertension Mother   . Crohn's disease Mother   . Diabetes Father   . Hyperlipidemia Father   . Asthma Sister   . Heart disease Neg Hx   . Kidney disease Neg Hx     EXAM: BP 125/55  Pulse 67  Temp(Src) 98.2 F (36.8 C) (Oral)  Resp 20  Ht 5\' 1"  (1.549 m)  Wt 173 lb (78.472 kg)  BMI 32.70 kg/m2  SpO2 100%  LMP 04/01/2013 CONSTITUTIONAL: Alert and oriented and responds appropriately to questions. Well-appearing; well-nourished HEAD: Normocephalic EYES: Conjunctivae clear, PERRL ENT: normal nose; no rhinorrhea; moist mucous membranes; pharynx without lesions noted NECK: Supple, no meningismus, no LAD  CARD: RRR; S1 and S2 appreciated; no murmurs, no clicks, no rubs, no gallops RESP: Normal chest excursion without splinting or tachypnea; breath sounds clear and equal bilaterally; no wheezes, no rhonchi, no rales,  ABD/GI: Normal bowel sounds; non-distended;  soft, non-tender, no rebound, no guarding BACK:  The back appears normal and is non-tender to palpation, there is no CVA tenderness EXT: Normal ROM in all joints; non-tender to palpation; no edema; normal capillary refill; no cyanosis    SKIN: Normal color for age and race; warm NEURO: Moves all extremities equally. Cranial nerves 2 3 4  intact. Sensation to light touch intact diffusely.  PSYCH: The patient's mood and manner are appropriate. Grooming and personal hygiene are  appropriate.  MEDICAL DECISION MAKING: Pt here with multiple symptoms. She is here with her typical migraine. No meningismus on exam. No neck pain or neck stiffness. No fever. No head injury. She is neurologically intact. Will give Toradol, Reglan, Benadryl IV fluids. Patient is also complaining of chest pain shortness of breath. We'll check EKG, chest x-ray, labs to rule out electrolyte abnormality or anemia, d-dimer given patient is on birth control. Patient also complaining of back pain and dysuria. No signs of pyelonephritis on exam. She is hemodynamically stable. She does have leukocytes in her urine but no other sign of infection. Urine culture pending.  ED PROGRESS: Patient's labs are reassuring. Negative troponin. She does have a slightly elevated d-dimer. Will obtain a CT of her chest. Chest x-ray clear. EKG shows no ischemic changes.   11:00 PM  CT chest shows no infiltrate, edema or pulmonary embolus. Patient reports no improvement her headache after Toradol, Reglan and Benadryl. Will give Decadron and Imitrex and reassess.  11:42 PM  Pt reports her headache has improved with Imitrex and Decadron. We'll discharge home with prescription for Imitrex, outpatient neurology followup and return precautions. Patient and mother at bedside verbalize understanding and are comfortable with plan.   EKG Interpretation    Date/Time:  Friday April 20 2013 20:17:26 EST Ventricular Rate:  63 PR Interval:  130 QRS Duration: 90 QT Interval:  462 QTC Calculation: 472 R Axis:   29 Text Interpretation:  Normal sinus rhythm Normal ECG Confirmed by Philemon Riedesel  DO, Maryruth Apple ([id number]) on 04/20/2013 8:59:19 PM             Layla Maw Deniesha Stenglein, DO 04/20/13 [id number]

## 2013-04-22 LAB — URINE CULTURE: Colony Count: 70000

## 2013-04-24 ENCOUNTER — Telehealth: Payer: Self-pay | Admitting: Family

## 2013-04-24 DIAGNOSIS — G43909 Migraine, unspecified, not intractable, without status migrainosus: Secondary | ICD-10-CM

## 2013-04-24 NOTE — Telephone Encounter (Signed)
Pt request call from nurse, would not tell me what this was about.

## 2013-04-25 MED ORDER — AMITRIPTYLINE HCL 25 MG PO TABS: 25.0000 mg | ORAL_TABLET | Freq: Every day | ORAL | Status: DC

## 2013-04-25 NOTE — Telephone Encounter (Signed)
Pt states she was seen in ED on 04/20/13 for migraine.  Received migraine cocktail in ER, has been taking immitrex without resolution. Also continues birth control. Pt states she has a constant, dull headache since the 04/20/13. Also has back pain. Said the ER discouraged pain meds for her back at this time as pain meds can trigger migraines?.  Wants to know what else she can do for her headache and back pain?  Please advise.

## 2013-04-25 NOTE — Telephone Encounter (Signed)
Left detailed message on cell and to call if any questions. 

## 2013-04-25 NOTE — Telephone Encounter (Signed)
I will give her rx for elavil to take at bedtime to hopefully help with migraine. Will also refer to neurology.  For back pain, she can use aleve PRN for short term, then switch to tylenol. If back symptoms worsen or do not improve needs office visit please.

## 2013-05-18 ENCOUNTER — Telehealth: Payer: Self-pay | Admitting: Family
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
## 2013-05-18 NOTE — Telephone Encounter (Signed)
Left message for pt to return my call.

## 2013-05-28 NOTE — Telephone Encounter (Signed)
Left detailed message on cell# that form is completed and will require $25 fee when she comes to pick up her copy.  [facility] attn: Angeline Agasino and faxed to [phone].

## 2013-05-28 NOTE — Telephone Encounter (Signed)
I have completed her FMLA form.

## 2013-06-15 ENCOUNTER — Ambulatory Visit: Payer: Self-pay | Admitting: Family

## 2013-06-20 ENCOUNTER — Encounter: Payer: Self-pay | Admitting: Family

## 2013-06-20 ENCOUNTER — Ambulatory Visit (INDEPENDENT_AMBULATORY_CARE_PROVIDER_SITE_OTHER): Payer: Self-pay | Admitting: Family

## 2013-06-20 VITALS — BP 114/78 | HR 89 | Temp 98.1°F | Resp 16 | Ht 61.5 in | Wt 172.0 lb

## 2013-06-20 DIAGNOSIS — N926 Irregular menstruation, unspecified: Secondary | ICD-10-CM

## 2013-06-20 DIAGNOSIS — G43909 Migraine, unspecified, not intractable, without status migrainosus: Secondary | ICD-10-CM

## 2013-06-20 DIAGNOSIS — M545 Low back pain, unspecified: Secondary | ICD-10-CM

## 2013-06-20 DIAGNOSIS — J029 Acute pharyngitis, unspecified: Secondary | ICD-10-CM

## 2013-06-20 LAB — POCT RAPID STREP A (OFFICE): RAPID STREP A SCREEN: NEGATIVE

## 2013-06-20 MED ORDER — METHYLPREDNISOLONE (PAK) 4 MG PO TABS: ORAL_TABLET | ORAL | Status: DC
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
## 2013-06-20 NOTE — Assessment & Plan Note (Signed)
Rapid strep is negative. Advised pt on supportive measures and to call if symptoms do not improve over the next few days.

## 2013-06-20 NOTE — Assessment & Plan Note (Signed)
Uncontrolled. Will refer to neurology for further evaluation.  ?

## 2013-06-20 NOTE — Patient Instructions (Signed)
You may use tylenol as needed for throat pain. Cepacol lozenges may also help. Call if sore throat does not improve in the next few days.  Start medrol dose pak. You will be contacted about your referral to the neurologist for your headaches and to physical therapy to help with your back pain.

## 2013-06-20 NOTE — Progress Notes (Signed)
Pre visit review using our clinic review tool, if applicable. No additional management support is needed unless otherwise documented below in the visit note. 

## 2013-06-20 NOTE — Assessment & Plan Note (Signed)
Improved with ocp.  Continue same

## 2013-06-20 NOTE — Assessment & Plan Note (Signed)
Deteriorated. Recommend medrol dose pak, referral to PT. Consider MRI if symptoms worsen or do not improve.

## 2013-06-22 ENCOUNTER — Encounter: Payer: Self-pay | Admitting: Family

## 2013-06-22 ENCOUNTER — Telehealth: Payer: Self-pay | Admitting: *Deleted

## 2013-06-22 NOTE — Telephone Encounter (Signed)
Left message on voice mail that letter is ready for pick up at the front desk.

## 2013-06-22 NOTE — Telephone Encounter (Signed)
See letter.
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
## 2013-06-22 NOTE — Telephone Encounter (Signed)
Received message from pt requesting letter with dates of service for her back pain, sore throat and migraines.  Attempted to reach pt and left detailed message that we have only seen her once in 2015 for these concerns and is she needing additional dates for the letter.

## 2013-06-27 ENCOUNTER — Ambulatory Visit: Payer: No Typology Code available for payment source | Attending: Family | Admitting: Rehabilitation

## 2013-06-27 DIAGNOSIS — IMO0002 Reserved for concepts with insufficient information to code with codable children: Secondary | ICD-10-CM | POA: Insufficient documentation

## 2013-06-27 DIAGNOSIS — IMO0001 Reserved for inherently not codable concepts without codable children: Secondary | ICD-10-CM | POA: Insufficient documentation

## 2013-07-02 ENCOUNTER — Ambulatory Visit: Payer: No Typology Code available for payment source | Admitting: Rehabilitation

## 2013-07-05 ENCOUNTER — Ambulatory Visit: Payer: No Typology Code available for payment source | Admitting: Rehabilitation

## 2013-07-09 ENCOUNTER — Ambulatory Visit: Payer: No Typology Code available for payment source | Admitting: Rehabilitation

## 2013-07-11 ENCOUNTER — Ambulatory Visit: Payer: No Typology Code available for payment source | Admitting: Rehabilitation

## 2013-07-16 ENCOUNTER — Telehealth: Payer: Self-pay | Admitting: Family

## 2013-07-16 ENCOUNTER — Ambulatory Visit: Payer: No Typology Code available for payment source | Admitting: Rehabilitation

## 2013-07-16 ENCOUNTER — Ambulatory Visit: Payer: Self-pay | Admitting: Neurology

## 2013-07-16 DIAGNOSIS — R110 Nausea: Secondary | ICD-10-CM

## 2013-07-16 MED ORDER — SUMATRIPTAN SUCCINATE 100 MG PO TABS: ORAL_TABLET | ORAL | Status: DC

## 2013-07-16 MED ORDER — ONDANSETRON 8 MG PO TBDP: 8.0000 mg | ORAL_TABLET | Freq: Three times a day (TID) | ORAL | Status: DC | PRN

## 2013-07-16 NOTE — Telephone Encounter (Signed)
Patient wants to know if something could be called in for nausea(from her migraines) and she also wants to know if we could give her a work note for today?

## 2013-07-16 NOTE — Telephone Encounter (Signed)
Ok for you to write letter for today only.    Sent in Rx for Zofran 8 mg tablets for nausea.  Can be taken up to TID.

## 2013-07-16 NOTE — Telephone Encounter (Signed)
Spoke with pt. We referred her to neuro for management of migraines. Pt cancelled appt with them stating she is self pay and currently unable to afford $185 requirement up front. Advised pt I will send refill of imitrex and she is currently out. Pt wants to know if we can provider her work excuse for today stating employer requires it even though she has FMLA for her migraines.  Also was requesting med to help for nausea. Please advise re: letter and nausea?

## 2013-07-17 ENCOUNTER — Emergency Department (HOSPITAL_BASED_OUTPATIENT_CLINIC_OR_DEPARTMENT_OTHER)
Admission: EM | Admit: 2013-07-17 | Discharge: 2013-07-17 | Disposition: A | Discharge status: Home / Self Care | Payer: Self-pay | Attending: Emergency Medicine | Admitting: Emergency Medicine

## 2013-07-17 ENCOUNTER — Encounter (HOSPITAL_BASED_OUTPATIENT_CLINIC_OR_DEPARTMENT_OTHER): Payer: Self-pay | Admitting: Emergency Medicine

## 2013-07-17 DIAGNOSIS — M545 Low back pain, unspecified: Secondary | ICD-10-CM | POA: Insufficient documentation

## 2013-07-17 DIAGNOSIS — Z8619 Personal history of other infectious and parasitic diseases: Secondary | ICD-10-CM | POA: Insufficient documentation

## 2013-07-17 DIAGNOSIS — G43909 Migraine, unspecified, not intractable, without status migrainosus: Secondary | ICD-10-CM | POA: Insufficient documentation

## 2013-07-17 DIAGNOSIS — R5383 Other fatigue: Secondary | ICD-10-CM | POA: Insufficient documentation

## 2013-07-17 DIAGNOSIS — R638 Other symptoms and signs concerning food and fluid intake: Secondary | ICD-10-CM | POA: Insufficient documentation

## 2013-07-17 DIAGNOSIS — R011 Cardiac murmur, unspecified: Secondary | ICD-10-CM | POA: Insufficient documentation

## 2013-07-17 DIAGNOSIS — M79605 Pain in left leg: Secondary | ICD-10-CM

## 2013-07-17 DIAGNOSIS — R5381 Other malaise: Secondary | ICD-10-CM | POA: Insufficient documentation

## 2013-07-17 DIAGNOSIS — Z8679 Personal history of other diseases of the circulatory system: Secondary | ICD-10-CM | POA: Insufficient documentation

## 2013-07-17 DIAGNOSIS — Z79899 Other long term (current) drug therapy: Secondary | ICD-10-CM | POA: Insufficient documentation

## 2013-07-17 MED ORDER — DEXAMETHASONE SODIUM PHOSPHATE 10 MG/ML IJ SOLN
10.0000 mg | Freq: Once | INTRAMUSCULAR | Status: AC
  Administered 2013-07-17: 10 mg via INTRAVENOUS
  Filled 2013-07-17: qty 1

## 2013-07-17 MED ORDER — PROMETHAZINE HCL 25 MG/ML IJ SOLN
12.5000 mg | Freq: Once | INTRAMUSCULAR | Status: AC
  Administered 2013-07-17: 12.5 mg via INTRAVENOUS
  Filled 2013-07-17 (×2): qty 1

## 2013-07-17 MED ORDER — HALOPERIDOL LACTATE 5 MG/ML IJ SOLN: 2.0000 mg | Freq: Once | INTRAMUSCULAR | Status: DC

## 2013-07-17 MED ORDER — HYDROCODONE-ACETAMINOPHEN 5-325 MG PO TABS: 1.0000 | ORAL_TABLET | Freq: Four times a day (QID) | ORAL | Status: DC | PRN

## 2013-07-17 MED ORDER — SODIUM CHLORIDE 0.9 % IV BOLUS (SEPSIS)
500.0000 mL | Freq: Once | INTRAVENOUS | Status: AC
  Administered 2013-07-17: 500 mL via INTRAVENOUS

## 2013-07-17 MED ORDER — HYDROCODONE-ACETAMINOPHEN 5-325 MG PO TABS
1.0000 | ORAL_TABLET | Freq: Once | ORAL | Status: AC
  Administered 2013-07-17: 1 via ORAL
  Filled 2013-07-17: qty 1

## 2013-07-17 MED ORDER — KETOROLAC TROMETHAMINE 30 MG/ML IJ SOLN
30.0000 mg | Freq: Once | INTRAMUSCULAR | Status: AC
Start: 2013-07-17 — End: 2013-07-17
  Administered 2013-07-17: 30 mg via INTRAVENOUS
  Filled 2013-07-17: qty 1

## 2013-07-17 MED ORDER — DIPHENHYDRAMINE HCL 50 MG/ML IJ SOLN
25.0000 mg | Freq: Once | INTRAMUSCULAR | Status: AC
  Administered 2013-07-17: 25 mg via INTRAVENOUS
  Filled 2013-07-17: qty 1
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
## 2013-07-17 NOTE — Discharge Instructions (Signed)
Migraine Headache A migraine headache is an intense, throbbing pain on one or both sides of your head. A migraine can last for 30 minutes to several hours. CAUSES  The exact cause of a migraine headache is not always known. However, a migraine may be caused when nerves in the brain become irritated and release chemicals that cause inflammation. This causes pain. Certain things may also trigger migraines, such as:  Alcohol.  Smoking.  Stress.  Menstruation.  Aged cheeses.  Foods or drinks that contain nitrates, glutamate, aspartame, or tyramine.  Lack of sleep.  Chocolate.  Caffeine.  Hunger.  Physical exertion.  Fatigue.  Medicines used to treat chest pain (nitroglycerine), birth control pills, estrogen, and some blood pressure medicines. SIGNS AND SYMPTOMS  Pain on one or both sides of your head.  Pulsating or throbbing pain.  Severe pain that prevents daily activities.  Pain that is aggravated by any physical activity.  Nausea, vomiting, or both.  Dizziness.  Pain with exposure to bright lights, loud noises, or activity.  General sensitivity to bright lights, loud noises, or smells. Before you get a migraine, you may get warning signs that a migraine is coming (aura). An aura may include:  Seeing flashing lights.  Seeing bright spots, halos, or zig-zag lines.  Having tunnel vision or blurred vision.  Having feelings of numbness or tingling.  Having trouble talking.  Having muscle weakness. DIAGNOSIS  A migraine headache is often diagnosed based on:  Symptoms.  Physical exam.  A CT scan or MRI of your head. These imaging tests cannot diagnose migraines, but they can help rule out other causes of headaches. TREATMENT Medicines may be given for pain and nausea. Medicines can also be given to help prevent recurrent migraines.  HOME CARE INSTRUCTIONS  Only take over-the-counter or prescription medicines for pain or discomfort as directed by your  health care provider. The use of long-term narcotics is not recommended.  Lie down in a dark, quiet room when you have a migraine.  Keep a journal to find out what may trigger your migraine headaches. For example, write down:  What you eat and drink.  How much sleep you get.  Any change to your diet or medicines.  Limit alcohol consumption.  Quit smoking if you smoke.  Get 7 9 hours of sleep, or as recommended by your health care provider.  Limit stress.  Keep lights dim if bright lights bother you and make your migraines worse. SEEK IMMEDIATE MEDICAL CARE IF:   Your migraine becomes severe.  You have a fever.  You have a stiff neck.  You have vision loss.  You have muscular weakness or loss of muscle control.  You start losing your balance or have trouble walking.  You feel faint or pass out.  You have severe symptoms that are different from your first symptoms. MAKE SURE YOU:   Understand these instructions.  Will watch your condition.  Will get help right away if you are not doing well or get worse. Document Released: 04/05/2005 Document Revised: 01/24/2013 Document Reviewed: 12/11/2012 [facility] Patient Information 2014 [place], [place].   Back Pain, Adult Low back pain is very common. About 1 in 5 people have back pain.The cause of low back pain is rarely dangerous. The pain often gets better over time.About half of people with a sudden onset of back pain feel better in just 2 weeks. About 8 in 10 people feel better by 6 weeks.  CAUSES Some common causes of back pain  include:  Strain of the muscles or ligaments supporting the spine.  Wear and tear (degeneration) of the spinal discs.  Arthritis.  Direct injury to the back. DIAGNOSIS Most of the time, the direct cause of low back pain is not known.However, back pain can be treated effectively even when the exact cause of the pain is unknown.Answering your caregiver's questions about your overall  health and symptoms is one of the most accurate ways to make sure the cause of your pain is not dangerous. If your caregiver needs more information, he or she may order lab work or imaging tests (X-rays or MRIs).However, even if imaging tests show changes in your back, this usually does not require surgery. HOME CARE INSTRUCTIONS For many people, back pain returns.Since low back pain is rarely dangerous, it is often a condition that people can learn to [facility] their own.   Remain active. It is stressful on the back to sit or stand in one place. Do not sit, drive, or stand in one place for more than 30 minutes at a time. Take short walks on level surfaces as soon as pain allows.Try to increase the length of time you walk each day.  Do not stay in bed.Resting more than 1 or 2 days can delay your recovery.  Do not avoid exercise or work.Your body is made to move.It is not dangerous to be active, even though your back may hurt.Your back will likely heal faster if you return to being active before your pain is gone.  Pay attention to your body when you bend and lift. Many people have less discomfortwhen lifting if they bend their knees, keep the load close to their bodies,and avoid twisting. Often, the most comfortable positions are those that put less stress on your recovering back.  Find a comfortable position to sleep. Use a firm mattress and lie on your side with your knees slightly bent. If you lie on your back, put a pillow under your knees.  Only take over-the-counter or prescription medicines as directed by your caregiver. Over-the-counter medicines to reduce pain and inflammation are often the most helpful.Your caregiver may prescribe muscle relaxant drugs.These medicines help dull your pain so you can more quickly return to your normal activities and healthy exercise.  Put ice on the injured area.  Put ice in a plastic bag.  Place a towel between your skin and the bag.  Leave  the ice on for 15-20 minutes, 03-04 times a day for the first 2 to 3 days. After that, ice and heat may be alternated to reduce pain and spasms.  Ask your caregiver about trying back exercises and gentle massage. This may be of some benefit.  Avoid feeling anxious or stressed.Stress increases muscle tension and can worsen back pain.It is important to recognize when you are anxious or stressed and learn ways to manage it.Exercise is a great option. SEEK MEDICAL CARE IF:  You have pain that is not relieved with rest or medicine.  You have pain that does not improve in 1 week.  You have new symptoms.  You are generally not feeling well. SEEK IMMEDIATE MEDICAL CARE IF:   You have pain that radiates from your back into your legs.  You develop new bowel or bladder control problems.  You have unusual weakness or numbness in your arms or legs.  You develop nausea or vomiting.  You develop abdominal pain.  You feel faint. Document Released: 04/05/2005 Document Revised: 10/05/2011 Document Reviewed: 08/24/2010 ExitCare Patient Information  2014 [employer]. ° °
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
## 2013-07-17 NOTE — Telephone Encounter (Signed)
Letter printed and placed at the front desk.  Left detailed message on cell# and to call if any questions.

## 2013-07-18 NOTE — ED Provider Notes (Signed)
Medical screening examination/treatment/procedure(s) were conducted as a shared visit with resident physician and myself.  I personally evaluated the patient during the encounter.   I interviewed and examined the patient. Lungs are CTAB. Cardiac exam wnl. Abdomen soft. Pt w/ hx of migraines, HA now c/w previous HA's. Also back pain which appears to be chronic. HA improved w/ migraine cocktail. Will rec f/u w/ pcp.   Junius ArgyleForrest S Raffaele Derise, MD 07/18/13 1105

## 2013-07-19 ENCOUNTER — Ambulatory Visit: Payer: No Typology Code available for payment source | Admitting: Rehabilitation

## 2013-07-23 ENCOUNTER — Telehealth: Payer: Self-pay | Admitting: Family

## 2013-07-23 ENCOUNTER — Ambulatory Visit: Payer: No Typology Code available for payment source | Admitting: Rehabilitation

## 2013-07-23 NOTE — Telephone Encounter (Signed)
Please call pt and arrange an ED follow up appointment in the next 1-2 weeks.

## 2013-07-24 NOTE — Telephone Encounter (Signed)
Acute appointment had already been scheduled for 07/31/13. Patient wanted to wait to see Melissa. Did not want to see anyone else. Left message to see if patient could come in at a different time on that day to allow enough time for an ED follow visit.

## 2013-07-25 ENCOUNTER — Ambulatory Visit: Payer: No Typology Code available for payment source | Admitting: Rehabilitation

## 2013-07-25 NOTE — Telephone Encounter (Signed)
ED follow up scheduled for 07/31/13

## 2013-07-31 ENCOUNTER — Ambulatory Visit (INDEPENDENT_AMBULATORY_CARE_PROVIDER_SITE_OTHER): Payer: Self-pay | Admitting: Family

## 2013-07-31 ENCOUNTER — Encounter: Payer: Self-pay | Admitting: Family

## 2013-07-31 ENCOUNTER — Ambulatory Visit: Payer: Self-pay | Admitting: Family

## 2013-07-31 VITALS — BP 110/76 | HR 78 | Temp 97.6°F | Resp 14 | Ht 61.5 in | Wt 173.1 lb

## 2013-07-31 DIAGNOSIS — G43909 Migraine, unspecified, not intractable, without status migrainosus: Secondary | ICD-10-CM

## 2013-07-31 DIAGNOSIS — M545 Low back pain, unspecified: Secondary | ICD-10-CM

## 2013-07-31 MED ORDER — PREDNISONE 5 MG PO TABS: ORAL_TABLET | ORAL | Status: DC

## 2013-07-31 MED ORDER — AMITRIPTYLINE HCL 25 MG PO TABS: 25.0000 mg | ORAL_TABLET | Freq: Every day | ORAL | Status: DC

## 2013-07-31 NOTE — Patient Instructions (Signed)
Start prednisone. Start elavil. Follow up in 1 month.

## 2013-07-31 NOTE — Progress Notes (Signed)
Pre visit review using our clinic review tool, if applicable. No additional management support is needed unless otherwise documented below in the visit note. 

## 2013-07-31 NOTE — Assessment & Plan Note (Signed)
Attempt pred taper- this is on the [employer] $4 plan. She asked re: FMLA for back pain. Advised pt that I will need to evaluate an MRI before I could write her out on FMLA for back pain. She does not have insurance and cannot afford MRI right now but she is working on trying to [employer] Foodsobtain insurance.

## 2013-07-31 NOTE — Progress Notes (Signed)
Subjective:    Patient ID: Erika Woodard, female    DOB: Apr 30, 1990, 23 y.o.   MRN: [id number]  HPI  Erika Woodard is a 23 yr old female who presents today for ED follow up. Records are reviewed.  She was seen in the ED on 07/17/13 with migraine. She was given decadron, phenergan, benadryl and toradol for HA in the ED. Reports she has had daily migraines since ED visit except for today she has no migraine.  She stopped OCP- she is not sexually active.    Back pain- she had an x ray of her lumbar spine on 04/26/12 which was normal. There was not obvious pathology to explain her back pain at that time.  She was given #15 tabs of norco during her ED visit. She reports mid back pain which radiates to her lower back. Back pain has become more constant.  Reports pain with driving. Hurts to sit, tand lay down.  Not improved with ice/heat or PT.  Was unable to afford the medrol dose pak. She is currently without insurance.  She is working on trying to reinstate her policy.    She was last seen at our office on 06/20/13 for back pain. At that time a medrol dose pak was prescribed and she was referred to PT.  She attended 3 weeks of physical therapy without much improvement in her symptoms.   Review of Systems    see HPI  Past Medical History  Diagnosis Date  . History of chicken pox   . Irregular heart beat   . Heart murmur     since birth  . Migraine     History   Social History  . Marital Status: Single    Spouse Name: N/A    Number of Children: 0  . Years of Education: N/A   Occupational History  . Not on file.   Social History Main Topics  . Smoking status: Never Smoker   . Smokeless tobacco: Never Used  . Alcohol Use: No  . Drug Use: No  . Sexual Activity: Not on file   Other Topics Concern  . Not on file   Social History Narrative   Regular exercise: no   Caffeine use: 1-2 daily   Associates degree    Works in Clinical biochemistCustomer Service at [employer]             Past Surgical  History  Procedure Laterality Date  . No past surgeries      Family History  Problem Relation Age of Onset  . Hypertension Mother   . Crohn's disease Mother   . Diabetes Father   . Hyperlipidemia Father   . Asthma Sister   . Heart disease Neg Hx   . Kidney disease Neg Hx     No Known Allergies  Current Outpatient Prescriptions on File Prior to Visit  Medication Sig Dispense Refill  . HYDROcodone-acetaminophen (NORCO) 5-325 MG per tablet Take 1 tablet by mouth every 6 (six) hours as needed for moderate pain.  20 tablet  0  . SUMAtriptan (IMITREX) 100 MG tablet Take 1 tablet at onset of migraine. May repeat in 2 hours if headache persists or recurs. Not to exceed 2 tablets in 24 hours.  10 tablet  1   No current facility-administered medications on file prior to visit.    BP 110/76  Pulse 78  Temp(Src) 97.6 F (36.4 C) (Oral)  Resp 14  Ht 5' 1.5" (1.562 m)  Wt 173 lb  1.3 oz (78.509 kg)  BMI 32.18 kg/m2  LMP 07/29/2013    Objective:   Physical Exam  Constitutional: She is oriented to person, place, and time. She appears well-developed and well-nourished. No distress.  HENT:  Head: Normocephalic.  Eyes: EOM are normal. Pupils are equal, round, and reactive to light.  Cardiovascular: Normal rate and regular rhythm.   No murmur heard. Pulmonary/Chest: Effort normal and breath sounds normal. No respiratory distress. She has no wheezes. She has no rales. She exhibits no tenderness.  Musculoskeletal: She exhibits no edema.       Thoracic back: She exhibits no tenderness.       Lumbar back: She exhibits no tenderness.  Bilateral LE strength is 5/5  Neurological: She is alert and oriented to person, place, and time.  Reflex Scores:      Patellar reflexes are 2+ on the right side and 2+ on the left side. Psychiatric: She has a normal mood and affect. Her behavior is normal. Judgment and thought content normal.          Assessment & Plan:

## 2013-07-31 NOTE — Assessment & Plan Note (Signed)
Will add elavil for migraine prophylaxis, continue prn imitrex.

## 2013-08-24 ENCOUNTER — Ambulatory Visit: Payer: Self-pay | Admitting: Family

## 2013-11-21 ENCOUNTER — Encounter (HOSPITAL_BASED_OUTPATIENT_CLINIC_OR_DEPARTMENT_OTHER): Payer: Self-pay | Admitting: Emergency Medicine

## 2013-11-21 ENCOUNTER — Telehealth: Payer: Self-pay | Admitting: Family

## 2013-11-21 ENCOUNTER — Emergency Department (HOSPITAL_BASED_OUTPATIENT_CLINIC_OR_DEPARTMENT_OTHER)
Admission: EM | Admit: 2013-11-21 | Discharge: 2013-11-21 | Disposition: A | Discharge status: Home / Self Care | Payer: Self-pay | Attending: Emergency Medicine | Admitting: Emergency Medicine

## 2013-11-21 ENCOUNTER — Emergency Department (HOSPITAL_BASED_OUTPATIENT_CLINIC_OR_DEPARTMENT_OTHER): Payer: Self-pay

## 2013-11-21 DIAGNOSIS — R071 Chest pain on breathing: Secondary | ICD-10-CM | POA: Insufficient documentation

## 2013-11-21 DIAGNOSIS — Z3202 Encounter for pregnancy test, result negative: Secondary | ICD-10-CM | POA: Insufficient documentation

## 2013-11-21 DIAGNOSIS — R519 Headache, unspecified: Secondary | ICD-10-CM

## 2013-11-21 DIAGNOSIS — G43909 Migraine, unspecified, not intractable, without status migrainosus: Secondary | ICD-10-CM | POA: Insufficient documentation

## 2013-11-21 DIAGNOSIS — R0989 Other specified symptoms and signs involving the circulatory and respiratory systems: Secondary | ICD-10-CM | POA: Insufficient documentation

## 2013-11-21 DIAGNOSIS — R079 Chest pain, unspecified: Secondary | ICD-10-CM

## 2013-11-21 DIAGNOSIS — Z8619 Personal history of other infectious and parasitic diseases: Secondary | ICD-10-CM | POA: Insufficient documentation

## 2013-11-21 DIAGNOSIS — R0600 Dyspnea, unspecified: Secondary | ICD-10-CM

## 2013-11-21 DIAGNOSIS — IMO0002 Reserved for concepts with insufficient information to code with codable children: Secondary | ICD-10-CM | POA: Insufficient documentation

## 2013-11-21 DIAGNOSIS — R011 Cardiac murmur, unspecified: Secondary | ICD-10-CM | POA: Insufficient documentation

## 2013-11-21 DIAGNOSIS — R0609 Other forms of dyspnea: Secondary | ICD-10-CM | POA: Insufficient documentation

## 2013-11-21 DIAGNOSIS — R51 Headache: Secondary | ICD-10-CM

## 2013-11-21 DIAGNOSIS — M940 Chondrocostal junction syndrome [Tietze]: Secondary | ICD-10-CM | POA: Insufficient documentation

## 2013-11-21 DIAGNOSIS — Z8679 Personal history of other diseases of the circulatory system: Secondary | ICD-10-CM | POA: Insufficient documentation

## 2013-11-21 DIAGNOSIS — Z8781 Personal history of (healed) traumatic fracture: Secondary | ICD-10-CM | POA: Insufficient documentation

## 2013-11-21 HISTORY — DX: Personal history of (healed) traumatic fracture: Z87.81

## 2013-11-21 LAB — URINALYSIS, ROUTINE W REFLEX MICROSCOPIC
BILIRUBIN URINE: NEGATIVE
Glucose, UA: NEGATIVE mg/dL
Hgb urine dipstick: NEGATIVE
Ketones, ur: NEGATIVE mg/dL
NITRITE: NEGATIVE
PH: 6.5 (ref 5.0–8.0)
Protein, ur: NEGATIVE mg/dL
SPECIFIC GRAVITY, URINE: 1.01 (ref 1.005–1.030)
Urobilinogen, UA: 1 mg/dL (ref 0.0–1.0)

## 2013-11-21 LAB — PREGNANCY, URINE: Preg Test, Ur: NEGATIVE

## 2013-11-21 LAB — URINE MICROSCOPIC-ADD ON

## 2013-11-21 MED ORDER — SODIUM CHLORIDE 0.9 % IV BOLUS (SEPSIS)
1000.0000 mL | Freq: Once | INTRAVENOUS | Status: AC
  Administered 2013-11-21: 1000 mL via INTRAVENOUS

## 2013-11-21 MED ORDER — KETOROLAC TROMETHAMINE 15 MG/ML IJ SOLN
15.0000 mg | Freq: Once | INTRAMUSCULAR | Status: AC
  Administered 2013-11-21: 15 mg via INTRAVENOUS
  Filled 2013-11-21: qty 1

## 2013-11-21 MED ORDER — DIPHENHYDRAMINE HCL 50 MG/ML IJ SOLN
25.0000 mg | Freq: Once | INTRAMUSCULAR | Status: AC
  Administered 2013-11-21: 25 mg via INTRAVENOUS
  Filled 2013-11-21: qty 1

## 2013-11-21 MED ORDER — DEXAMETHASONE SODIUM PHOSPHATE 10 MG/ML IJ SOLN
10.0000 mg | Freq: Once | INTRAMUSCULAR | Status: AC
  Administered 2013-11-21: 10 mg via INTRAVENOUS
  Filled 2013-11-21: qty 1

## 2013-11-21 MED ORDER — METOCLOPRAMIDE HCL 5 MG/ML IJ SOLN
10.0000 mg | Freq: Once | INTRAMUSCULAR | Status: AC
  Administered 2013-11-21: 10 mg via INTRAVENOUS
  Filled 2013-11-21: qty 2

## 2013-11-21 NOTE — ED Notes (Signed)
MD at bedside. 

## 2013-11-21 NOTE — Telephone Encounter (Signed)
Left detailed message informing patient to call and schedule ED follow up visit

## 2013-11-21 NOTE — ED Notes (Signed)
Patient states she has a history of migraines and right rib pain since March 2014.  States she has had increased pain in her right rib area with a migraine headache for the last three days.  States since yesterday morning she has had tightness in her central chest.

## 2013-11-21 NOTE — Telephone Encounter (Signed)
Appointment scheduled for 11/23/13 °
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
## 2013-11-21 NOTE — Telephone Encounter (Signed)
Please contact pt to arrange ED follow up Friday or Monday.

## 2013-11-21 NOTE — Discharge Instructions (Signed)
Costochondritis Costochondritis, sometimes called Tietze syndrome, is a swelling and irritation (inflammation) of the tissue (cartilage) that connects your ribs with your breastbone (sternum). It causes pain in the chest and rib area. Costochondritis usually goes away on its own over time. It can take up to 6 weeks or longer to get better, especially if you are unable to limit your activities. CAUSES  Some cases of costochondritis have no known cause. Possible causes include:  Injury (trauma).  Exercise or activity such as lifting.  Severe coughing. SIGNS AND SYMPTOMS  Pain and tenderness in the chest and rib area.  Pain that gets worse when coughing or taking deep breaths.  Pain that gets worse with specific movements. DIAGNOSIS  Your health care provider will do a physical exam and ask about your symptoms. Chest X-rays or other tests may be done to rule out other problems. TREATMENT  Costochondritis usually goes away on its own over time. Your health care provider may prescribe medicine to help relieve pain. HOME CARE INSTRUCTIONS   Avoid exhausting physical activity. Try not to strain your ribs during normal activity. This would include any activities using chest, abdominal, and side muscles, especially if heavy weights are used.  Apply ice to the affected area for the first 2 days after the pain begins.  Put ice in a plastic bag.  Place a towel between your skin and the bag.  Leave the ice on for 20 minutes, 2-3 times a day.  Only take over-the-counter or prescription medicines as directed by your health care provider. SEEK MEDICAL CARE IF:  You have redness or swelling at the rib joints. These are signs of infection.  Your pain does not go away despite rest or medicine. SEEK IMMEDIATE MEDICAL CARE IF:   Your pain increases or you are very uncomfortable.  You have shortness of breath or difficulty breathing.  You cough up blood.  You have worse chest pains,  sweating, or vomiting.  You have a fever or persistent symptoms for more than 2-3 days.  You have a fever and your symptoms suddenly get worse. MAKE SURE YOU:   Understand these instructions.  Will watch your condition.  Will get help right away if you are not doing well or get worse. Document Released: 01/13/2005 Document Revised: 01/24/2013 Document Reviewed: 11/07/2012 Exit[employer] Patient Information 2015 [employer]. This information is not intended to replace advice given to you by your health care provider. Make sure you discuss any questions you have with your health care provider.  

## 2013-11-23 ENCOUNTER — Encounter: Payer: Self-pay | Admitting: Family

## 2013-11-23 ENCOUNTER — Ambulatory Visit (INDEPENDENT_AMBULATORY_CARE_PROVIDER_SITE_OTHER): Payer: Self-pay | Admitting: Family

## 2013-11-23 VITALS — BP 104/72 | HR 58 | Temp 98.2°F | Resp 16 | Ht 61.5 in | Wt 181.0 lb

## 2013-11-23 DIAGNOSIS — G43709 Chronic migraine without aura, not intractable, without status migrainosus: Secondary | ICD-10-CM

## 2013-11-23 DIAGNOSIS — M940 Chondrocostal junction syndrome [Tietze]: Secondary | ICD-10-CM | POA: Insufficient documentation

## 2013-11-23 NOTE — Progress Notes (Signed)
Pre visit review using our clinic review tool, if applicable. No additional management support is needed unless otherwise documented below in the visit note. 

## 2013-11-23 NOTE — Assessment & Plan Note (Signed)
Prn use of tylenol/motrin.

## 2013-11-23 NOTE — Patient Instructions (Signed)
Let me know when you obtain health insurance and we will get you set up with neurology for your headaches. You may use ibuprofen (sparingly) and tylenol as needed for chest wall pain.
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
## 2013-11-23 NOTE — Assessment & Plan Note (Signed)
Continues to have headaches, no improvement with imitrex. Historically was unable to afford elavil. Considered beta blocker for migraine prevention, but her heart rate is only 58 so I don't think this would be a good choice for her. She hopes to obtain health insurance soon and is agreeable to proceed with neurology referral when she does obtain insurance.  Previously she did not follow through with referral due to cost.  In the meantime, recommend tylenol or motrin prn.

## 2014-03-25 ENCOUNTER — Ambulatory Visit (INDEPENDENT_AMBULATORY_CARE_PROVIDER_SITE_OTHER): Payer: Self-pay | Admitting: Physician Assistant

## 2014-03-25 ENCOUNTER — Encounter: Payer: Self-pay | Admitting: Physician Assistant

## 2014-03-25 VITALS — BP 112/72 | HR 72 | Temp 98.2°F | Wt 178.0 lb

## 2014-03-25 DIAGNOSIS — Z3201 Encounter for pregnancy test, result positive: Secondary | ICD-10-CM

## 2014-03-25 DIAGNOSIS — R109 Unspecified abdominal pain: Secondary | ICD-10-CM

## 2014-03-25 DIAGNOSIS — R1030 Lower abdominal pain, unspecified: Secondary | ICD-10-CM

## 2014-03-25 LAB — CBC
HEMATOCRIT: 39.8 % (ref 36.0–46.0)
Hemoglobin: 13 g/dL (ref 12.0–15.0)
MCHC: 32.5 g/dL (ref 30.0–36.0)
MCV: 82.8 fl (ref 78.0–100.0)
Platelets: 308 10*3/uL (ref 150.0–400.0)
RBC: 4.81 Mil/uL (ref 3.87–5.11)
RDW: 14.9 % (ref 11.5–15.5)
WBC: 6.5 10*3/uL (ref 4.0–10.5)

## 2014-03-25 LAB — BASIC METABOLIC PANEL
BUN: 10 mg/dL (ref 6–23)
CALCIUM: 9.1 mg/dL (ref 8.4–10.5)
CO2: 22 mEq/L (ref 19–32)
Chloride: 106 mEq/L (ref 96–112)
Creatinine, Ser: 0.8 mg/dL (ref 0.4–1.2)
GFR: 109.97 mL/min (ref >60.00)
GLUCOSE: 81 mg/dL (ref 70–99)
Potassium: 3.4 mEq/L — ABNORMAL LOW (ref 3.5–5.1)
SODIUM: 136 meq/L (ref 135–145)

## 2014-03-25 LAB — POCT URINALYSIS DIPSTICK
Bilirubin, UA: NEGATIVE
Blood, UA: NEGATIVE
Glucose, UA: NEGATIVE
Ketones, UA: 0.5
Nitrite, UA: NEGATIVE
PH UA: 6
PROTEIN UA: NEGATIVE
SPEC GRAV UA: 1.02
Urobilinogen, UA: 0.2

## 2014-03-25 LAB — HCG, QUANTITATIVE, PREGNANCY: Quantitative HCG: 48181 m[IU]/mL

## 2014-03-25 LAB — POCT URINE PREGNANCY: Preg Test, Ur: POSITIVE

## 2014-03-25 NOTE — Progress Notes (Signed)
Pre visit review using our clinic review tool, if applicable. No additional management support is needed unless otherwise documented below in the visit note. 

## 2014-03-25 NOTE — Patient Instructions (Signed)
Please stop by the lab for testing. I will call you with your results.  Return to lab Wednesday for repeat testing.  This will help us confirm a normal pregnancy. Tylenol for pain.  If you develop severe pain, vaginal bleeding or new symptoms, please go to the ER as this may represent an ectopic pregnancy.

## 2014-03-26 ENCOUNTER — Telehealth: Payer: Self-pay | Admitting: Physician Assistant

## 2014-03-26 LAB — URINE CULTURE
COLONY COUNT: NO GROWTH
Organism ID, Bacteria: NO GROWTH

## 2014-03-26 NOTE — Telephone Encounter (Signed)
Left detailed message and advised to call back.
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
## 2014-03-26 NOTE — Telephone Encounter (Addendum)
Please call patient to confirm she is not taking the Elavil due to confirmed pregnancy as it is unknown whether this medication is harmful to the child.

## 2014-03-27 ENCOUNTER — Other Ambulatory Visit (INDEPENDENT_AMBULATORY_CARE_PROVIDER_SITE_OTHER): Payer: Self-pay

## 2014-03-27 ENCOUNTER — Ambulatory Visit (HOSPITAL_BASED_OUTPATIENT_CLINIC_OR_DEPARTMENT_OTHER): Payer: Self-pay

## 2014-03-27 ENCOUNTER — Telehealth: Payer: Self-pay | Admitting: Physician Assistant

## 2014-03-27 ENCOUNTER — Other Ambulatory Visit: Payer: Self-pay | Admitting: Physician Assistant

## 2014-03-27 DIAGNOSIS — Z3201 Encounter for pregnancy test, result positive: Secondary | ICD-10-CM

## 2014-03-27 DIAGNOSIS — Z3490 Encounter for supervision of normal pregnancy, unspecified, unspecified trimester: Secondary | ICD-10-CM | POA: Insufficient documentation

## 2014-03-27 DIAGNOSIS — R109 Unspecified abdominal pain: Secondary | ICD-10-CM | POA: Insufficient documentation

## 2014-03-27 DIAGNOSIS — IMO0002 Reserved for concepts with insufficient information to code with codable children: Secondary | ICD-10-CM

## 2014-03-27 DIAGNOSIS — R799 Abnormal finding of blood chemistry, unspecified: Secondary | ICD-10-CM

## 2014-03-27 LAB — HCG, QUANTITATIVE, PREGNANCY: Quantitative HCG: 55852 m[IU]/mL

## 2014-03-27 NOTE — Assessment & Plan Note (Signed)
Urine pregnancy is positive. Giving mild cramping, will check Beta Hcg Quantitative and repeat in 48 hours to assess doubling. Tylenol for pain if needed.  Patient not currently on any chronic medications to be harmful to fetus.  Alarm signs/symptoms, more specifically for ectopic pregnancy, were discussed with patient. Will refer to OB/GYN for her pregnancy.

## 2014-03-27 NOTE — Telephone Encounter (Signed)
Patient's HCG levels are not increasing like they should within a 48 hour frame.  I have ordered at STAT US to assess if there is a viable pregnancy or if there is a potential ectopic pregnancy.  It is very important the patient has this done. If she develops severe abdominal pain or vaginal bleeding she is to go to the ER.  Attempted to reach patient but no answer.  Please attempt her again.

## 2014-03-27 NOTE — Telephone Encounter (Signed)
Patient states that she is not taking the Elavil any longer.

## 2014-03-28 ENCOUNTER — Ambulatory Visit (HOSPITAL_BASED_OUTPATIENT_CLINIC_OR_DEPARTMENT_OTHER)
Admission: RE | Admit: 2014-03-28 | Discharge: 2014-03-28 | Disposition: A | Discharge status: Home / Self Care | Payer: Medicaid Other | Source: Ambulatory Visit | Attending: Physician Assistant | Admitting: Physician Assistant

## 2014-03-28 ENCOUNTER — Other Ambulatory Visit (HOSPITAL_BASED_OUTPATIENT_CLINIC_OR_DEPARTMENT_OTHER): Payer: Self-pay

## 2014-03-28 DIAGNOSIS — R252 Cramp and spasm: Secondary | ICD-10-CM | POA: Insufficient documentation

## 2014-03-28 DIAGNOSIS — O468X9 Other antepartum hemorrhage, unspecified trimester: Secondary | ICD-10-CM | POA: Insufficient documentation

## 2014-03-28 DIAGNOSIS — R799 Abnormal finding of blood chemistry, unspecified: Secondary | ICD-10-CM

## 2014-03-28 DIAGNOSIS — Z3490 Encounter for supervision of normal pregnancy, unspecified, unspecified trimester: Secondary | ICD-10-CM | POA: Insufficient documentation

## 2014-03-28 DIAGNOSIS — O0281 Inappropriate change in quantitative human chorionic gonadotropin (hCG) in early pregnancy: Secondary | ICD-10-CM | POA: Insufficient documentation

## 2014-03-28 DIAGNOSIS — IMO0002 Reserved for concepts with insufficient information to code with codable children: Secondary | ICD-10-CM

## 2014-03-28 NOTE — Telephone Encounter (Signed)
No answer on patient's available number.

## 2014-03-29 NOTE — Telephone Encounter (Signed)
Patient had US as recommended.

## 2014-04-29 ENCOUNTER — Encounter (HOSPITAL_BASED_OUTPATIENT_CLINIC_OR_DEPARTMENT_OTHER): Payer: Self-pay | Admitting: Emergency Medicine

## 2014-04-29 ENCOUNTER — Emergency Department (HOSPITAL_BASED_OUTPATIENT_CLINIC_OR_DEPARTMENT_OTHER)
Admission: EM | Admit: 2014-04-29 | Discharge: 2014-04-29 | Disposition: A | Discharge status: Home / Self Care | Payer: Medicaid Other | Attending: Emergency Medicine | Admitting: Emergency Medicine

## 2014-04-29 DIAGNOSIS — N390 Urinary tract infection, site not specified: Secondary | ICD-10-CM

## 2014-04-29 DIAGNOSIS — Z8619 Personal history of other infectious and parasitic diseases: Secondary | ICD-10-CM | POA: Diagnosis not present

## 2014-04-29 DIAGNOSIS — Z8679 Personal history of other diseases of the circulatory system: Secondary | ICD-10-CM | POA: Diagnosis not present

## 2014-04-29 DIAGNOSIS — O2341 Unspecified infection of urinary tract in pregnancy, first trimester: Secondary | ICD-10-CM | POA: Diagnosis not present

## 2014-04-29 DIAGNOSIS — O9989 Other specified diseases and conditions complicating pregnancy, childbirth and the puerperium: Secondary | ICD-10-CM | POA: Diagnosis present

## 2014-04-29 DIAGNOSIS — Z3A10 10 weeks gestation of pregnancy: Secondary | ICD-10-CM | POA: Insufficient documentation

## 2014-04-29 DIAGNOSIS — R011 Cardiac murmur, unspecified: Secondary | ICD-10-CM | POA: Insufficient documentation

## 2014-04-29 DIAGNOSIS — Z3491 Encounter for supervision of normal pregnancy, unspecified, first trimester: Secondary | ICD-10-CM

## 2014-04-29 LAB — URINALYSIS, ROUTINE W REFLEX MICROSCOPIC
Bilirubin Urine: NEGATIVE
Glucose, UA: NEGATIVE mg/dL
Hgb urine dipstick: NEGATIVE
Ketones, ur: NEGATIVE mg/dL
NITRITE: NEGATIVE
Protein, ur: NEGATIVE mg/dL
Specific Gravity, Urine: 1.012 (ref 1.005–1.030)
Urobilinogen, UA: 0.2 mg/dL (ref 0.0–1.0)
pH: 7.5 (ref 5.0–8.0)

## 2014-04-29 LAB — URINE MICROSCOPIC-ADD ON

## 2014-04-29 LAB — PREGNANCY, URINE: Preg Test, Ur: POSITIVE — AB

## 2014-04-29 MED ORDER — CEPHALEXIN 500 MG PO CAPS: 500.0000 mg | ORAL_CAPSULE | Freq: Four times a day (QID) | ORAL | Status: DC

## 2014-04-29 NOTE — ED Notes (Signed)
Pt confirmed pregnant at PCP Dec 4th. Pt states last period was oct for two weeks which irregular period is normal for her. Denies bleeding. Continued cramping the whole pregnancy so far. 1st pregnancy.
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
## 2014-04-29 NOTE — ED Notes (Signed)
MD at bedside. 

## 2014-05-03 ENCOUNTER — Telehealth: Payer: Self-pay | Admitting: Family

## 2014-05-03 DIAGNOSIS — N390 Urinary tract infection, site not specified: Secondary | ICD-10-CM

## 2014-05-03 MED ORDER — AMOXICILLIN-POT CLAVULANATE 875-125 MG PO TABS: 1.0000 | ORAL_TABLET | Freq: Two times a day (BID) | ORAL | Status: DC

## 2014-05-03 NOTE — Telephone Encounter (Signed)
Since she is pregnant we really need a urine culture- this was not done in the ED.  I would like her to come in this afternoon for urine culture and will leave rx for abx.  If symptoms worsen over the weekend, if low back pain, fever >100, go to ED.

## 2014-05-03 NOTE — Telephone Encounter (Signed)
Left detailed message on pt's cell #. Rx placed at front desk for pick up. Order entered for urine culture.

## 2014-05-03 NOTE — Telephone Encounter (Signed)
Caller name: Enjoli Relation to pt: self Call back number: [phone] Pharmacy: [facility] on [address]  Reason for call:   Patient states that she was seen in the ED last Tuesday with a UTI and has two pills of keflex left and she still has urinary problems. She would like something else called in.

## 2014-05-06 ENCOUNTER — Telehealth: Payer: Self-pay | Admitting: *Deleted

## 2014-05-06 NOTE — Telephone Encounter (Signed)
No, she needs a urine culture please.

## 2014-05-06 NOTE — Telephone Encounter (Signed)
AMY the UDS lady states that she collected a urine sample but pt isnt on the UDS list . Did you want a UDS on this patient or no??

## 2014-05-09 ENCOUNTER — Other Ambulatory Visit: Payer: Self-pay

## 2014-05-09 ENCOUNTER — Telehealth: Payer: Self-pay | Admitting: Family

## 2014-05-09 NOTE — Telephone Encounter (Signed)
Caller name:Aguilera, Aamari Relation to ZO:XWRUpt:self Call back number:[phone] Pharmacy:  Reason for call: pt would like for you to call her states she has a question regarding a otc drug.

## 2014-05-09 NOTE — Telephone Encounter (Signed)
Spoke with pt. She states she has a head cold and wanted to know what she could take OTC. Advised pt to check with GYN for recommendations safe during pregnancy and pt voices understanding.

## 2014-05-09 NOTE — Telephone Encounter (Signed)
Done . Pt came today to drop off sample.

## 2014-05-10 LAB — URINE CULTURE
COLONY COUNT: NO GROWTH
ORGANISM ID, BACTERIA: NO GROWTH

## 2014-05-11 ENCOUNTER — Encounter: Payer: Self-pay | Admitting: Family

## 2014-08-30 ENCOUNTER — Ambulatory Visit: Payer: Self-pay | Admitting: Family

## 2014-09-05 ENCOUNTER — Telehealth: Payer: Self-pay | Admitting: Family

## 2014-09-05 NOTE — Telephone Encounter (Signed)
RX FOR AUGMENTIN FROM 05-03-2014 NOT PICKED UP  SHREDDED

## 2015-01-03 ENCOUNTER — Encounter: Payer: Self-pay | Admitting: Family

## 2015-01-03 ENCOUNTER — Ambulatory Visit (INDEPENDENT_AMBULATORY_CARE_PROVIDER_SITE_OTHER): Payer: Medicaid Other | Admitting: Family

## 2015-01-03 VITALS — BP 100/70 | HR 70 | Temp 98.4°F | Resp 16 | Ht 61.5 in | Wt 159.8 lb

## 2015-01-03 DIAGNOSIS — R1011 Right upper quadrant pain: Secondary | ICD-10-CM | POA: Diagnosis not present

## 2015-01-03 DIAGNOSIS — Z23 Encounter for immunization: Secondary | ICD-10-CM | POA: Diagnosis not present

## 2015-01-03 NOTE — Patient Instructions (Signed)
I will send you your ultrasound results via mychart. Please complete lab work prior to leaving. Go to ER if severe/worsening abdominal pain or if unable to keep down food/liquid.
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
## 2015-01-03 NOTE — Progress Notes (Signed)
Pre visit review using our clinic review tool, if applicable. No additional management support is needed unless otherwise documented below in the visit note. 

## 2015-01-09 ENCOUNTER — Ambulatory Visit (HOSPITAL_BASED_OUTPATIENT_CLINIC_OR_DEPARTMENT_OTHER)
Admission: RE | Admit: 2015-01-09 | Discharge: 2015-01-09 | Disposition: A | Discharge status: Home / Self Care | Payer: Medicaid Other | Source: Ambulatory Visit | Attending: Family | Admitting: Family

## 2015-01-09 ENCOUNTER — Telehealth: Payer: Self-pay | Admitting: Family

## 2015-01-09 DIAGNOSIS — K8020 Calculus of gallbladder without cholecystitis without obstruction: Secondary | ICD-10-CM | POA: Diagnosis not present

## 2015-01-09 DIAGNOSIS — R1011 Right upper quadrant pain: Secondary | ICD-10-CM | POA: Insufficient documentation

## 2015-01-09 NOTE — Telephone Encounter (Signed)
See mychart.  

## 2015-01-15 ENCOUNTER — Telehealth: Payer: Self-pay | Admitting: Family

## 2015-01-15 MED ORDER — TRAMADOL HCL 50 MG PO TABS: 50.0000 mg | ORAL_TABLET | Freq: Three times a day (TID) | ORAL | Status: DC | PRN

## 2015-01-15 NOTE — Telephone Encounter (Signed)
Pt states she was seen for gallbladder issue recently and is asking if she can get something for pain. Please call at [phone].

## 2015-01-15 NOTE — Telephone Encounter (Signed)
Notified pt and faxed Rx to [facility].

## 2015-01-15 NOTE — Telephone Encounter (Signed)
Spoke with pt. Not currently taking anything for pain. States she is NOT currently breast feeding. She is still under her pregnancy medicaid and it will not pay for her to have gall bladder removal. States she has called social services to see if she can get regular medicaid to be able to have surgery.  She states that lately pain has become more constant and she is requesting med for pain.  Please advise.

## 2015-01-15 NOTE — Telephone Encounter (Signed)
Will give 1 time rx for tramadol.

## 2015-04-30 ENCOUNTER — Telehealth: Payer: Self-pay | Admitting: Family

## 2015-04-30 DIAGNOSIS — K8020 Calculus of gallbladder without cholecystitis without obstruction: Secondary | ICD-10-CM

## 2015-04-30 NOTE — Telephone Encounter (Signed)
Relation to ZO:XWRUpt:self  Call back number:[phone]   Reason for call:  Patient requesting a referral regarding her gallbladder patient states PCP is aware

## 2015-05-13 ENCOUNTER — Ambulatory Visit: Payer: Self-pay | Admitting: General Surgery

## 2015-05-13 NOTE — H&P (Signed)
History of Present Illness Axel Filler MD; 05/13/2015 3:13 PM) Patient words: evaluate gallbladder.  The patient is a 25 year old female who presents for evaluation of gall stones. The patient is a 25 year old female who is referred by Sandford Craze, nurse practitioner for evaluation of symptomatic cholelithiasis. Patient states that she's had some abdominal pain since the middle to end of her last pregnancy. She states that the pain at that time with sporadic and assisted with meals. Patient states that currently the pain is fairly constant. She has been on a bland diet.  Patient underwent ultrasound revealed multiple gallstones.   Other Problems Maryan Puls, CMA; 05/13/2015 2:57 PM) Back Pain Heart murmur Migraine Headache  Past Surgical History Maryan Puls, CMA; 05/13/2015 2:57 PM) No pertinent past surgical history  Diagnostic Studies History Maryan Puls, [place]; 05/13/2015 2:57 PM) Pap Smear 1-5 years ago  Allergies Maryan Puls, CMA; 05/13/2015 2:57 PM) No Known Drug Allergies01/24/2017  Medication History Maryan Puls, CMA; 05/13/2015 2:58 PM) Ibuprofen (  Tablet, Oral) Active. Medications Reconciled  Social History Maryan Puls, [place]; 05/13/2015 2:57 PM) Caffeine use Carbonated beverages. No alcohol use No drug use Tobacco use Never smoker.  Family History Maryan Puls, [place]; 05/13/2015 2:57 PM) Bleeding disorder Mother. Hypertension Mother. Ischemic Bowel Disease Mother.  Pregnancy / Birth History Maryan Puls, [place]; 05/13/2015 2:57 PM) Age at menarche 9 years. Contraceptive History Intrauterine device. Gravida 1 Irregular periods Maternal age 41-25 Para 1    Review of Systems Maryan Puls CMA; 05/13/2015 2:57 PM) General Not Present- Appetite Loss, Chills, Fatigue, Fever, Night Sweats, Weight Gain and Weight Loss. Skin Not Present- Change in Wart/Mole, Dryness, Hives, Jaundice, New Lesions, Non-Healing Wounds,  Rash and Ulcer. HEENT Not Present- Earache, Hearing Loss, Hoarseness, Nose Bleed, Oral Ulcers, Ringing in the Ears, Seasonal Allergies, Sinus Pain, Sore Throat, Visual Disturbances, Wears glasses/contact lenses and Yellow Eyes. Respiratory Not Present- Bloody sputum, Chronic Cough, Difficulty Breathing, Snoring and Wheezing. Breast Not Present- Breast Mass, Breast Pain, Nipple Discharge and Skin Changes. Cardiovascular Not Present- Chest Pain, Difficulty Breathing Lying Down, Leg Cramps, Palpitations, Rapid Heart Rate, Shortness of Breath and Swelling of Extremities. Gastrointestinal Present- Abdominal Pain. Not Present- Bloating, Bloody Stool, Change in Bowel Habits, Chronic diarrhea, Constipation, Difficulty Swallowing, Excessive gas, Gets full quickly at meals, Hemorrhoids, Indigestion, Nausea, Rectal Pain and Vomiting. Female Genitourinary Not Present- Frequency, Nocturia, Painful Urination, Pelvic Pain and Urgency. Musculoskeletal Not Present- Back Pain, Joint Pain, Joint Stiffness, Muscle Pain, Muscle Weakness and Swelling of Extremities. Neurological Not Present- Decreased Memory, Fainting, Headaches, Numbness, Seizures, Tingling, Tremor, Trouble walking and Weakness. Psychiatric Not Present- Anxiety, Bipolar, Change in Sleep Pattern, Depression, Fearful and Frequent crying. Endocrine Not Present- Cold Intolerance, Excessive Hunger, Hair Changes, Heat Intolerance, Hot flashes and New Diabetes. Hematology Not Present- Easy Bruising, Excessive bleeding, Gland problems, HIV and Persistent Infections.  Vitals Maryan Puls CMA; 05/13/2015 2:57 PM) 05/13/2015 2:57 PM Weight: 182.2 lb Temp.: 27F(Temporal)  Pulse: 70 (Regular)  Resp.: 16 (Unlabored)  BP: 118/72 (Sitting, Left Arm, Standard)       Physical Exam Axel Filler, MD; 05/13/2015 3:12 PM) General Mental Status-Alert. General Appearance-Consistent with stated age. Hydration-Well  hydrated. Voice-Normal.  Head and Neck Head-normocephalic, atraumatic with no lesions or palpable masses.  Eye Eyeball - Bilateral-Extraocular movements intact. Sclera/Conjunctiva - Bilateral-No scleral icterus.  Chest and Lung Exam Chest and lung exam reveals -quiet, even and easy respiratory effort with no use of accessory muscles. Inspection Chest Wall - Normal. Back - normal.  Cardiovascular Cardiovascular examination  reveals -normal heart sounds, regular rate and rhythm with no murmurs.  Abdomen Inspection Normal Exam - No Hernias. Palpation/Percussion Normal exam - Soft, Non Tender, No Rebound tenderness, No Rigidity (guarding) and No hepatosplenomegaly. Auscultation Normal exam - Bowel sounds normal.  Neurologic Neurologic evaluation reveals -alert and oriented x 3 with no impairment of recent or remote memory. Mental Status-Normal.  Musculoskeletal Normal Exam - Left-Upper Extremity Strength Normal and Lower Extremity Strength Normal. Normal Exam - Right-Upper Extremity Strength Normal, Lower Extremity Weakness.    Assessment & Plan Axel Filler MD; 05/13/2015 3:14 PM) SYMPTOMATIC CHOLELITHIASIS (K80.20) Impression: 25 year old female with symptomatic cholelithiasis  1. The patient would like to proceed to the operating for a laparoscopic cholecystectomy 2. Risks and benefits were discussed with the patient to generally include, but not limited to: infection, bleeding, possible need for post op ERCP, damage to the bile ducts, bile leak, and possible need for further surgery. Alternatives were offered and described. All questions were answered and the patient voiced understanding of the procedure and wishes to proceed at this point with a laparoscopic cholecystectomy

## 2015-10-20 ENCOUNTER — Encounter (HOSPITAL_BASED_OUTPATIENT_CLINIC_OR_DEPARTMENT_OTHER): Payer: Self-pay | Admitting: *Deleted

## 2015-10-20 ENCOUNTER — Emergency Department (HOSPITAL_BASED_OUTPATIENT_CLINIC_OR_DEPARTMENT_OTHER)
Admission: EM | Admit: 2015-10-20 | Discharge: 2015-10-20 | Disposition: A | Discharge status: Home / Self Care | Payer: 59 | Attending: Emergency Medicine | Admitting: Emergency Medicine

## 2015-10-20 ENCOUNTER — Telehealth: Payer: Self-pay | Admitting: Family

## 2015-10-20 ENCOUNTER — Encounter: Payer: Self-pay | Admitting: Family

## 2015-10-20 ENCOUNTER — Emergency Department (HOSPITAL_BASED_OUTPATIENT_CLINIC_OR_DEPARTMENT_OTHER): Payer: 59

## 2015-10-20 ENCOUNTER — Ambulatory Visit (INDEPENDENT_AMBULATORY_CARE_PROVIDER_SITE_OTHER): Payer: Self-pay | Admitting: Family

## 2015-10-20 VITALS — BP 100/70 | HR 84 | Temp 98.5°F | Resp 16 | Ht 61.5 in | Wt 187.4 lb

## 2015-10-20 DIAGNOSIS — R079 Chest pain, unspecified: Secondary | ICD-10-CM

## 2015-10-20 DIAGNOSIS — R110 Nausea: Secondary | ICD-10-CM | POA: Diagnosis not present

## 2015-10-20 DIAGNOSIS — R42 Dizziness and giddiness: Secondary | ICD-10-CM

## 2015-10-20 DIAGNOSIS — R0789 Other chest pain: Secondary | ICD-10-CM

## 2015-10-20 DIAGNOSIS — H8110 Benign paroxysmal vertigo, unspecified ear: Secondary | ICD-10-CM

## 2015-10-20 LAB — CBC WITH DIFFERENTIAL/PLATELET
Basophils Absolute: 0 10*3/uL (ref 0.0–0.1)
Basophils Relative: 0 %
EOS ABS: 0.1 10*3/uL (ref 0.0–0.7)
EOS PCT: 1 %
HCT: 38.2 % (ref 36.0–46.0)
HEMOGLOBIN: 13.2 g/dL (ref 12.0–15.0)
LYMPHS ABS: 3.2 10*3/uL (ref 0.7–4.0)
Lymphocytes Relative: 39 %
MCH: 27.4 pg (ref 26.0–34.0)
MCHC: 34.6 g/dL (ref 30.0–36.0)
MCV: 79.3 fL (ref 78.0–100.0)
MONO ABS: 0.6 10*3/uL (ref 0.1–1.0)
MONOS PCT: 7 %
NEUTROS PCT: 53 %
Neutro Abs: 4.3 10*3/uL (ref 1.7–7.7)
Platelets: 344 10*3/uL (ref 150–400)
RBC: 4.82 MIL/uL (ref 3.87–5.11)
RDW: 13.8 % (ref 11.5–15.5)
WBC: 8.2 10*3/uL (ref 4.0–10.5)

## 2015-10-20 LAB — BASIC METABOLIC PANEL
Anion gap: 10 (ref 5–15)
BUN: 10 mg/dL (ref 6–20)
CALCIUM: 8.9 mg/dL (ref 8.9–10.3)
CHLORIDE: 106 mmol/L (ref 101–111)
CO2: 23 mmol/L (ref 22–32)
CREATININE: 0.77 mg/dL (ref 0.44–1.00)
GFR calc Af Amer: >60 mL/min (ref >60)
GFR calc non Af Amer: >60 mL/min (ref >60)
Glucose, Bld: 94 mg/dL (ref 65–99)
Potassium: 3.4 mmol/L — ABNORMAL LOW (ref 3.5–5.1)
SODIUM: 139 mmol/L (ref 135–145)

## 2015-10-20 LAB — D-DIMER, QUANTITATIVE (NOT AT ARMC): D DIMER QUANT: 0.59 ug{FEU}/mL — AB (ref <0.50)

## 2015-10-20 LAB — POCT URINE HCG BY VISUAL COLOR COMPARISON TESTS: Preg Test, Ur: NEGATIVE

## 2015-10-20 MED ORDER — IOPAMIDOL (ISOVUE-370) INJECTION 76%
100.0000 mL | Freq: Once | INTRAVENOUS | Status: AC | PRN
  Administered 2015-10-20: 100 mL via INTRAVENOUS

## 2015-10-20 MED ORDER — MECLIZINE HCL 32 MG PO TABS: 32.0000 mg | ORAL_TABLET | Freq: Three times a day (TID) | ORAL | Status: DC | PRN

## 2015-10-20 NOTE — Patient Instructions (Signed)
Please complete lab work prior to leaving.   

## 2015-10-20 NOTE — ED Notes (Addendum)
Pt. In no resp.distress.

## 2015-10-20 NOTE — Discharge Instructions (Signed)
Please read and follow all provided instructions.  Your diagnoses today include:  1. Dizziness   2. Chest pain, unspecified chest pain type     Tests performed today include:  Blood counts and electrolytes - no concerning findings  CT scan of chest - no signs of blood clots or other lung problems  Vital signs. See below for your results today.   Medications prescribed:   None  Take any prescribed medications only as directed.  Home care instructions:  Follow any educational materials contained in this packet.  BE VERY CAREFUL not to take multiple medicines containing Tylenol (also called acetaminophen). Doing so can lead to an overdose which can damage your liver and cause liver failure and possibly death.   Follow-up instructions: Please follow-up with your primary care provider in the next 7 days for further evaluation of your symptoms.   Return instructions:   Please return to the Emergency Department if you experience worsening symptoms.   Please return if you have any other emergent concerns.  Additional Information:  Your vital signs today were: BP 101/55 mmHg   Pulse 78   Temp(Src) 99.2 F (37.3 C) (Oral)   Resp 16   Ht 5\' 1"  (1.549 m)   Wt 84.823 kg   BMI 35.35 kg/m2   SpO2 100%   LMP 10/01/2015 If your blood pressure (BP) was elevated above 135/85 this visit, please have this repeated by your doctor within one month. --------------
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
## 2015-10-20 NOTE — ED Notes (Addendum)
She was seen by her Sandford CrazeMelissa O'Sullivan earlier today for dizziness and lightheadedness. She had an EKG and blood work. She was called at home tonight and told she has an elevation in a blood test and she needed to come to the ED. Telephone note of positive D Dimer noted.

## 2015-10-20 NOTE — ED Notes (Signed)
Pt. Reports she has had dizziness for months and lately she feels like she has had some shortness of breath so she saw her PMD yesterday.

## 2015-10-20 NOTE — Telephone Encounter (Signed)
Reviewed + D Dimer results with patient.  Advised [facility] to [facility]. She verbalizes understanding. Report given to Dr. Anitra LauthPlunkett ER attending on duty.

## 2015-10-20 NOTE — ED Notes (Signed)
Patient transported to CT 
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
## 2015-10-20 NOTE — ED Notes (Signed)
Family at bedside. 

## 2015-10-20 NOTE — Progress Notes (Signed)
Pre visit review using our clinic review tool, if applicable. No additional management support is needed unless otherwise documented below in the visit note. 

## 2015-10-24 ENCOUNTER — Encounter: Payer: Self-pay | Admitting: Family

## 2015-10-24 ENCOUNTER — Ambulatory Visit (INDEPENDENT_AMBULATORY_CARE_PROVIDER_SITE_OTHER): Payer: 59 | Admitting: Family

## 2015-10-24 VITALS — BP 110/80 | HR 72 | Temp 98.4°F | Resp 16 | Ht 61.5 in | Wt 185.4 lb

## 2015-10-24 DIAGNOSIS — R42 Dizziness and giddiness: Secondary | ICD-10-CM | POA: Diagnosis not present
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
## 2015-10-24 NOTE — Progress Notes (Signed)
Pre visit review using our clinic review tool, if applicable. No additional management support is needed unless otherwise documented below in the visit note. 

## 2015-10-24 NOTE — Patient Instructions (Signed)
Please begin meclizine- let me know via mychart how it is working in 1 week. You will be contacted about scheduling your MRI.

## 2015-11-03 ENCOUNTER — Telehealth: Payer: Self-pay | Admitting: *Deleted

## 2015-11-03 NOTE — Telephone Encounter (Signed)
[facility]vert 32[facility]  has been discontinued.  Please advise what pt should do? I checked with our pharmacy last week and they did not have the 32mg  either.

## 2015-11-03 NOTE — Telephone Encounter (Signed)
We can send 25mg  instead please.

## 2015-11-04 MED ORDER — MECLIZINE HCL 25 MG PO TABS: 25.0000 mg | ORAL_TABLET | Freq: Three times a day (TID) | ORAL | Status: DC | PRN

## 2015-11-04 NOTE — Telephone Encounter (Signed)
New Rx sent. Notified pt. Pt requested status of MRI brain. Per POC, still under case review. Candise BowensJen will call for update.

## 2015-11-05 NOTE — Telephone Encounter (Signed)
Patient is scheduled for MRI on 11/09/15

## 2015-11-05 NOTE — Addendum Note (Signed)
Addended by: Sandford Craze'SULLIVAN, Zalan Shidler on: 11/05/2015 02:55 PM   Modules accepted: Orders

## 2015-11-05 NOTE — Telephone Encounter (Signed)
Erika Woodard-- did you find out status of MRI?

## 2015-11-09 ENCOUNTER — Encounter: Payer: Self-pay | Admitting: Family

## 2015-11-09 ENCOUNTER — Ambulatory Visit (HOSPITAL_BASED_OUTPATIENT_CLINIC_OR_DEPARTMENT_OTHER)
Admission: RE | Admit: 2015-11-09 | Discharge: 2015-11-09 | Disposition: A | Discharge status: Home / Self Care | Payer: 59 | Source: Ambulatory Visit | Attending: Family | Admitting: Family

## 2015-11-09 DIAGNOSIS — R42 Dizziness and giddiness: Secondary | ICD-10-CM | POA: Insufficient documentation

## 2015-11-09 MED ORDER — GADOBENATE DIMEGLUMINE 529 MG/ML IV SOLN: 15.0000 mL | Freq: Once | INTRAVENOUS | Status: DC | PRN

## 2015-11-21 ENCOUNTER — Encounter: Payer: Self-pay | Admitting: Family

## 2015-11-21 ENCOUNTER — Ambulatory Visit (INDEPENDENT_AMBULATORY_CARE_PROVIDER_SITE_OTHER): Payer: 59 | Admitting: Family

## 2015-11-21 VITALS — BP 106/66 | HR 67 | Temp 98.2°F | Resp 18 | Ht 61.5 in | Wt 187.4 lb

## 2015-11-21 DIAGNOSIS — H8110 Benign paroxysmal vertigo, unspecified ear: Secondary | ICD-10-CM | POA: Diagnosis not present
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
## 2015-11-21 NOTE — Patient Instructions (Signed)
Continue meclizine as needed. You will be contacted about your referral for Vestibular rehab to help your vertigo. Let me know if you have not heard back about this appointment in 1 week.  Continue meclizine as needed.

## 2015-11-21 NOTE — Progress Notes (Signed)
Pre visit review using our clinic review tool, if applicable. No additional management support is needed unless otherwise documented below in the visit note. 

## 2015-11-24 NOTE — Addendum Note (Signed)
Addended by: Sandford Craze'SULLIVAN, Ambriana Selway on: 11/24/2015 12:06 PM   Modules accepted: Orders

## 2015-12-03 ENCOUNTER — Ambulatory Visit: Payer: 59 | Attending: Family | Admitting: Physical Therapy

## 2015-12-03 DIAGNOSIS — R42 Dizziness and giddiness: Secondary | ICD-10-CM | POA: Diagnosis not present

## 2015-12-03 NOTE — Patient Instructions (Signed)
Gaze Stabilization: Sitting    Keeping eyes on target on wall 3-5 feet away, tilt head down 15-30 and move head side to side for __30__ seconds. Repeat while moving head up and down for __30__ seconds. Do __2-3__ sessions per day.  Copyright  VHI. All rights reserved.   Gaze Stabilization: Tip Card  1.Target must remain in focus, not blurry, and appear stationary while head is in motion. 2.Perform exercises with small head movements (45 to either side of midline). 3.Increase speed of head motion so long as target is in focus. 4.If you wear eyeglasses, be sure you can see target through lens (therapist will give specific instructions for bifocal / progressive lenses). 5.These exercises may provoke dizziness or nausea. Work through these symptoms. If too dizzy, slow head movement slightly. Rest between each exercise. 6.Exercises demand concentration; avoid distractions.  Copyright  VHI. All rights reserved.    
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
## 2015-12-10 ENCOUNTER — Ambulatory Visit: Payer: 59 | Admitting: Physical Therapy

## 2015-12-10 DIAGNOSIS — R42 Dizziness and giddiness: Secondary | ICD-10-CM
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
## 2015-12-10 NOTE — Patient Instructions (Signed)
  STAND IN A CORNER WITH A CHAIR IN FRONT OF YOU FOR SAFETY.  Feet Together (Compliant Surface) Varied Arm Positions - Eyes Closed    Stand on compliant surface: _pillow or foam_ with feet together and arms at your side. Close eyes and stand still. Hold__10-15__ seconds. Repeat __5__ times per session. Do __1-2__ sessions per day.  Copyright  VHI. All rights reserved.   Feet Together (Compliant Surface) Head Motion - Eyes Open    With eyes open, standing on compliant surface: _pillow or foam___, feet together, move head slowly: up and down 10 times and side to side 10 times. Repeat _1___ times per session. Do _1-2___ sessions per day.  Copyright  VHI. All rights reserved.    Feet Apart (Compliant Surface) Head Motion - Eyes Closed    Stand on compliant surface: _pillow or foam__ with feet shoulder width apart. Close eyes and move head slowly, up and down 10 times and side to side 10 times. Repeat _1___ times per session. Do _1-2___ sessions per day.  Copyright  VHI. All rights reserved.

## 2015-12-17 ENCOUNTER — Ambulatory Visit: Payer: 59 | Admitting: Physical Therapy

## 2015-12-17 DIAGNOSIS — R42 Dizziness and giddiness: Secondary | ICD-10-CM
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
## 2015-12-24 ENCOUNTER — Ambulatory Visit: Payer: 59 | Attending: Family | Admitting: Physical Therapy

## 2015-12-24 DIAGNOSIS — R42 Dizziness and giddiness: Secondary | ICD-10-CM | POA: Diagnosis not present
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
## 2016-01-23 ENCOUNTER — Ambulatory Visit: Payer: 59 | Admitting: Family

## 2016-05-10 ENCOUNTER — Telehealth: Payer: Self-pay | Admitting: Family

## 2016-05-10 ENCOUNTER — Emergency Department (HOSPITAL_BASED_OUTPATIENT_CLINIC_OR_DEPARTMENT_OTHER): Payer: BLUE CROSS/BLUE SHIELD

## 2016-05-10 ENCOUNTER — Emergency Department (HOSPITAL_BASED_OUTPATIENT_CLINIC_OR_DEPARTMENT_OTHER)
Admission: EM | Admit: 2016-05-10 | Discharge: 2016-05-11 | Disposition: A | Discharge status: Home / Self Care | Payer: BLUE CROSS/BLUE SHIELD | Attending: Emergency Medicine | Admitting: Emergency Medicine

## 2016-05-10 ENCOUNTER — Encounter (HOSPITAL_BASED_OUTPATIENT_CLINIC_OR_DEPARTMENT_OTHER): Payer: Self-pay

## 2016-05-10 DIAGNOSIS — J069 Acute upper respiratory infection, unspecified: Secondary | ICD-10-CM

## 2016-05-10 DIAGNOSIS — N3000 Acute cystitis without hematuria: Secondary | ICD-10-CM | POA: Diagnosis not present

## 2016-05-10 DIAGNOSIS — R05 Cough: Secondary | ICD-10-CM | POA: Diagnosis present

## 2016-05-10 DIAGNOSIS — B9789 Other viral agents as the cause of diseases classified elsewhere: Secondary | ICD-10-CM

## 2016-05-10 LAB — URINALYSIS, MICROSCOPIC (REFLEX)

## 2016-05-10 LAB — URINALYSIS, ROUTINE W REFLEX MICROSCOPIC
Bilirubin Urine: NEGATIVE
GLUCOSE, UA: NEGATIVE mg/dL
HGB URINE DIPSTICK: NEGATIVE
KETONES UR: NEGATIVE mg/dL
Nitrite: NEGATIVE
PROTEIN: NEGATIVE mg/dL
Specific Gravity, Urine: 1.013 (ref 1.005–1.030)
pH: 7.5 (ref 5.0–8.0)

## 2016-05-10 LAB — PREGNANCY, URINE: PREG TEST UR: NEGATIVE

## 2016-05-10 NOTE — ED Notes (Signed)
Patient transported to X-ray 

## 2016-05-10 NOTE — Telephone Encounter (Signed)
TELEPHONE ADVICE RECORD [facility]  Patient Name: Erika Woodard Better  DOB: 01/19/1991    Initial Comment Caller states coughing up blood, has had stomach bug, pain in lower right abd   Nurse Assessment  Nurse: Odis LusterBowers, RN, Bjorn Loserhonda Date/Time Lamount Cohen(Eastern Time): 05/10/2016 1:48:47 PM  Confirm and document reason for call. If symptomatic, describe symptoms. ---Caller states coughing up blood, has had stomach bug, pain in lower right abd. No vomiting since Friday night. Denies fever. Has had body aches and chills. Bright red blood and small amount.  Does the patient have any new or worsening symptoms? ---Yes  Will a triage be completed? ---Yes  Related visit to physician within the last 2 weeks? ---No  Does the PT have any chronic conditions? (i.e. diabetes, asthma, etc.) ---No  Is the patient pregnant or possibly pregnant? (Ask all females between the ages of 8012-55) ---No  Is this a behavioral health or substance abuse call? ---No     Guidelines    Guideline Title Affirmed Question Affirmed Notes  Coughing Up Blood [1] Coughed up blood AND [2] > 1 tablespoon (15 ml) (Exception: blood-tinged sputum)    Final Disposition User   See Physician within 4 Hours (or PCP triage) Odis LusterBowers, RN, Rhonda    Referrals  [facility]   Disagree/Comply: Comply

## 2016-05-10 NOTE — Telephone Encounter (Signed)
Called to follow up with patient.  Pt states she did not go to UC today, but has an appt with one tomorrow morning.  Pt was encouraged if symptoms worsen to go to the ER.  She stated understanding and agreed.

## 2016-05-10 NOTE — Telephone Encounter (Signed)
Patient called stating that she is coughing up blood. Transferred to [facility]. Spoke with Lawson FiscalLori

## 2016-05-10 NOTE — Telephone Encounter (Signed)
See triage phone note.

## 2016-05-10 NOTE — ED Triage Notes (Signed)
C/o n/v/d last week-c/o abd pain, prod bloody cough x 3 days-NAD-steady gait

## 2016-05-11 ENCOUNTER — Ambulatory Visit: Payer: Self-pay

## 2016-05-11 MED ORDER — CEPHALEXIN 500 MG PO CAPS: 500.0000 mg | ORAL_CAPSULE | Freq: Two times a day (BID) | ORAL | 0 refills | Status: DC

## 2016-05-11 MED ORDER — IBUPROFEN 800 MG PO TABS
800.0000 mg | ORAL_TABLET | Freq: Once | ORAL | Status: AC
  Administered 2016-05-11: 800 mg via ORAL
  Filled 2016-05-11: qty 1

## 2016-05-11 MED ORDER — CEPHALEXIN 250 MG PO CAPS
500.0000 mg | ORAL_CAPSULE | Freq: Once | ORAL | Status: AC
  Administered 2016-05-11: 500 mg via ORAL
  Filled 2016-05-11: qty 2

## 2016-05-11 MED FILL — CEPHALEXIN 500 MG CAPSULE: 500 | 7 days supply | Qty: 14 | Fill #0

## 2016-05-11 NOTE — Discharge Instructions (Signed)
You may alternate between Tylenol 1000 mg every 6 hours as needed for fever and pain and ibuprofen 800 mg every 8 hours as needed for fever and pain. °

## 2016-05-11 NOTE — ED Notes (Signed)
ED Provider at bedside. 
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
## 2016-05-12 LAB — URINE CULTURE

## 2016-09-21 ENCOUNTER — Encounter (HOSPITAL_BASED_OUTPATIENT_CLINIC_OR_DEPARTMENT_OTHER): Payer: Self-pay | Admitting: *Deleted

## 2016-09-21 ENCOUNTER — Emergency Department (HOSPITAL_BASED_OUTPATIENT_CLINIC_OR_DEPARTMENT_OTHER): Payer: BLUE CROSS/BLUE SHIELD

## 2016-09-21 ENCOUNTER — Emergency Department (HOSPITAL_BASED_OUTPATIENT_CLINIC_OR_DEPARTMENT_OTHER)
Admission: EM | Admit: 2016-09-21 | Discharge: 2016-09-22 | Disposition: A | Discharge status: Home / Self Care | Payer: BLUE CROSS/BLUE SHIELD | Attending: Emergency Medicine | Admitting: Emergency Medicine

## 2016-09-21 DIAGNOSIS — R1011 Right upper quadrant pain: Secondary | ICD-10-CM | POA: Diagnosis present

## 2016-09-21 DIAGNOSIS — R1013 Epigastric pain: Secondary | ICD-10-CM | POA: Diagnosis not present

## 2016-09-21 DIAGNOSIS — R112 Nausea with vomiting, unspecified: Secondary | ICD-10-CM | POA: Diagnosis not present

## 2016-09-21 DIAGNOSIS — R52 Pain, unspecified: Secondary | ICD-10-CM

## 2016-09-21 LAB — CBC WITH DIFFERENTIAL/PLATELET
Basophils Absolute: 0 10*3/uL (ref 0.0–0.1)
Basophils Relative: 0 %
Eosinophils Absolute: 0.1 10*3/uL (ref 0.0–0.7)
Eosinophils Relative: 2 %
HEMATOCRIT: 36.5 % (ref 36.0–46.0)
HEMOGLOBIN: 12.4 g/dL (ref 12.0–15.0)
LYMPHS ABS: 3.5 10*3/uL (ref 0.7–4.0)
Lymphocytes Relative: 50 %
MCH: 27 pg (ref 26.0–34.0)
MCHC: 34 g/dL (ref 30.0–36.0)
MCV: 79.5 fL (ref 78.0–100.0)
MONOS PCT: 7 %
Monocytes Absolute: 0.5 10*3/uL (ref 0.1–1.0)
NEUTROS ABS: 2.8 10*3/uL (ref 1.7–7.7)
NEUTROS PCT: 41 %
Platelets: 322 10*3/uL (ref 150–400)
RBC: 4.59 MIL/uL (ref 3.87–5.11)
RDW: 14.1 % (ref 11.5–15.5)
WBC: 6.9 10*3/uL (ref 4.0–10.5)

## 2016-09-21 LAB — COMPREHENSIVE METABOLIC PANEL
ALT: 18 U/L (ref 14–54)
ANION GAP: 8 (ref 5–15)
AST: 20 U/L (ref 15–41)
Albumin: 4 g/dL (ref 3.5–5.0)
Alkaline Phosphatase: 91 U/L (ref 38–126)
BUN: 8 mg/dL (ref 6–20)
CHLORIDE: 105 mmol/L (ref 101–111)
CO2: 24 mmol/L (ref 22–32)
Calcium: 8.8 mg/dL — ABNORMAL LOW (ref 8.9–10.3)
Creatinine, Ser: 0.7 mg/dL (ref 0.44–1.00)
GFR calc non Af Amer: >60 mL/min (ref >60)
Glucose, Bld: 108 mg/dL — ABNORMAL HIGH (ref 65–99)
Potassium: 3.5 mmol/L (ref 3.5–5.1)
SODIUM: 137 mmol/L (ref 135–145)
Total Bilirubin: 0.3 mg/dL (ref 0.3–1.2)
Total Protein: 7.2 g/dL (ref 6.5–8.1)

## 2016-09-21 LAB — URINALYSIS, ROUTINE W REFLEX MICROSCOPIC
Bilirubin Urine: NEGATIVE
GLUCOSE, UA: NEGATIVE mg/dL
Hgb urine dipstick: NEGATIVE
KETONES UR: NEGATIVE mg/dL
Nitrite: NEGATIVE
PH: 6 (ref 5.0–8.0)
Protein, ur: NEGATIVE mg/dL
Specific Gravity, Urine: 1.013 (ref 1.005–1.030)

## 2016-09-21 LAB — URINALYSIS, MICROSCOPIC (REFLEX)

## 2016-09-21 LAB — LIPASE, BLOOD: Lipase: 20 U/L (ref 11–51)

## 2016-09-21 LAB — PREGNANCY, URINE: Preg Test, Ur: NEGATIVE

## 2016-09-21 MED ORDER — FENTANYL CITRATE (PF) 100 MCG/2ML IJ SOLN
50.0000 ug | Freq: Once | INTRAMUSCULAR | Status: AC
  Administered 2016-09-21: 50 ug via INTRAVENOUS
  Filled 2016-09-21: qty 2

## 2016-09-21 MED ORDER — ONDANSETRON HCL 4 MG/2ML IJ SOLN
4.0000 mg | Freq: Once | INTRAMUSCULAR | Status: AC
  Administered 2016-09-21: 4 mg via INTRAVENOUS
  Filled 2016-09-21: qty 2
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
## 2016-09-21 NOTE — ED Triage Notes (Signed)
Abdominal pain x 5 hours. Sharp, stabbing pain. Nausea. Vomited x 4 today. Constipation for a few days.

## 2016-09-22 MED ORDER — ONDANSETRON HCL 4 MG PO TABS: 4.0000 mg | ORAL_TABLET | Freq: Four times a day (QID) | ORAL | 0 refills | Status: DC

## 2016-09-22 MED ORDER — KETOROLAC TROMETHAMINE 30 MG/ML IJ SOLN
15.0000 mg | Freq: Once | INTRAMUSCULAR | Status: AC
  Administered 2016-09-22: 15 mg via INTRAVENOUS
  Filled 2016-09-22: qty 1

## 2016-09-22 MED ORDER — PROMETHAZINE HCL 25 MG/ML IJ SOLN
12.5000 mg | Freq: Once | INTRAMUSCULAR | Status: AC
  Administered 2016-09-22: 12.5 mg via INTRAVENOUS
  Filled 2016-09-22: qty 1

## 2016-09-22 MED ORDER — HYOSCYAMINE SULFATE SL 0.125 MG SL SUBL: 0.1250 mg | SUBLINGUAL_TABLET | Freq: Four times a day (QID) | SUBLINGUAL | 0 refills | Status: DC | PRN

## 2016-09-22 MED ORDER — HYDROCODONE-ACETAMINOPHEN 5-325 MG PO TABS: 1.0000 | ORAL_TABLET | Freq: Four times a day (QID) | ORAL | 0 refills | Status: DC | PRN

## 2016-09-22 NOTE — Discharge Instructions (Signed)
No acute signs of cholecystitis. Her labs are reassuring. Have given you Levsin to help with gallbladder pain. Uses Zofran for nausea. I give you short course of Norco for pain but should be doing NSAIDs which helps with gallbladder symptoms more. Make sure you follow-up with your surgeon. Return to the ED if he develops any fevers worsening vomiting, worsening pain.

## 2016-09-22 NOTE — ED Notes (Signed)
Pt taken to US

## 2016-09-22 NOTE — ED Notes (Signed)
Assumed care of patient from Katie, [place]. Pt resting quietly. No distress. Awaiting disposition.

## 2016-09-23 LAB — URINE CULTURE

## 2016-10-04 ENCOUNTER — Telehealth: Payer: Self-pay | Admitting: Family

## 2016-10-04 NOTE — Telephone Encounter (Signed)
Patient Name: Erika Woodard  DOB: 06-24-1990    Initial Comment Caller states that she has had blood in her stool for about a week. She has been taking Mira lax and it is still not helpig her symptoms.    Nurse Assessment  Nurse: Stefano GaulStringer, RN, Vera Date/Time (Eastern Time): 10/04/2016 1:20:19 PM  Confirm and document reason for call. If symptomatic, describe symptoms. ---Caller states she has had bright red blood in her stool for a week. She has been constipated. Has been taking Miralax. she is having BMs but they are hard and she has to strain to have a BM.  Does the patient have any new or worsening symptoms? ---Yes  Will a triage be completed? ---Yes  Related visit to physician within the last 2 weeks? ---No  Does the PT have any chronic conditions? (i.e. diabetes, asthma, etc.) ---No  Is the patient pregnant or possibly pregnant? (Ask all females between the ages of 6912-55) ---No  Is this a behavioral health or substance abuse call? ---No     Guidelines    Guideline Title Affirmed Question Affirmed Notes  Rectal Bleeding MILD rectal bleeding (more than just a few drops or streaks)    Final Disposition User   See PCP When Office is Open (within 3 days) Stefano GaulStringer, RN, Dwana CurdVera    Comments  appt scheduled for 10/06/2016 at 12:45 pm with Sandford CrazeMelissa O'Sullivan   Disagree/Comply: Comply

## 2016-10-04 NOTE — Telephone Encounter (Signed)
Patient called stating she's had blood in her stool for about a week. States she is constipated and wanted to schedule appt to see provider. Transferred pt to [facility]

## 2016-10-06 ENCOUNTER — Encounter: Payer: Self-pay | Admitting: Family

## 2016-10-06 ENCOUNTER — Ambulatory Visit (INDEPENDENT_AMBULATORY_CARE_PROVIDER_SITE_OTHER): Payer: BLUE CROSS/BLUE SHIELD | Admitting: Family

## 2016-10-06 VITALS — BP 109/62 | HR 78 | Temp 98.4°F | Resp 18 | Ht 61.5 in | Wt 198.2 lb

## 2016-10-06 DIAGNOSIS — K5900 Constipation, unspecified: Secondary | ICD-10-CM | POA: Diagnosis not present

## 2016-10-06 DIAGNOSIS — K625 Hemorrhage of anus and rectum: Secondary | ICD-10-CM | POA: Diagnosis not present

## 2016-10-06 LAB — CBC WITH DIFFERENTIAL/PLATELET
Basophils Absolute: 0 10*3/uL (ref 0.0–0.1)
Basophils Relative: 0.5 % (ref 0.0–3.0)
EOS ABS: 0.1 10*3/uL (ref 0.0–0.7)
Eosinophils Relative: 1.1 % (ref 0.0–5.0)
HEMATOCRIT: 37.3 % (ref 36.0–46.0)
Hemoglobin: 12.3 g/dL (ref 12.0–15.0)
LYMPHS PCT: 43.5 % (ref 12.0–46.0)
Lymphs Abs: 2.7 10*3/uL (ref 0.7–4.0)
MCHC: 32.9 g/dL (ref 30.0–36.0)
MCV: 81 fl (ref 78.0–100.0)
MONO ABS: 0.3 10*3/uL (ref 0.1–1.0)
Monocytes Relative: 5.4 % (ref 3.0–12.0)
Neutro Abs: 3.1 10*3/uL (ref 1.4–7.7)
Neutrophils Relative %: 49.5 % (ref 43.0–77.0)
Platelets: 290 10*3/uL (ref 150.0–400.0)
RBC: 4.61 Mil/uL (ref 3.87–5.11)
RDW: 14.8 % (ref 11.5–15.5)
WBC: 6.2 10*3/uL (ref 4.0–10.5)

## 2016-10-06 NOTE — Patient Instructions (Signed)
Please add a fiber supplement such as metamucil once daily as well as a probiotic once daily. You may continue miralax 1 capful daily. Drink 8 glasses of water a day and eat lots of fresh fruits/veggies.  You will be contacted about your referral to GI. Please go to the ER if if you have a lot of bright red blood in the stool.
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
## 2016-11-05 ENCOUNTER — Encounter: Payer: Self-pay | Admitting: Gastroenterology

## 2016-11-25 ENCOUNTER — Emergency Department (HOSPITAL_BASED_OUTPATIENT_CLINIC_OR_DEPARTMENT_OTHER)
Admission: EM | Admit: 2016-11-25 | Discharge: 2016-11-25 | Disposition: A | Discharge status: Home / Self Care | Payer: BLUE CROSS/BLUE SHIELD | Attending: Emergency Medicine | Admitting: Emergency Medicine

## 2016-11-25 ENCOUNTER — Encounter (HOSPITAL_BASED_OUTPATIENT_CLINIC_OR_DEPARTMENT_OTHER): Payer: Self-pay | Admitting: Emergency Medicine

## 2016-11-25 ENCOUNTER — Emergency Department (HOSPITAL_BASED_OUTPATIENT_CLINIC_OR_DEPARTMENT_OTHER): Payer: BLUE CROSS/BLUE SHIELD

## 2016-11-25 DIAGNOSIS — R42 Dizziness and giddiness: Secondary | ICD-10-CM | POA: Diagnosis present

## 2016-11-25 LAB — URINALYSIS, MICROSCOPIC (REFLEX): RBC / HPF: NONE SEEN RBC/hpf (ref 0–5)

## 2016-11-25 LAB — CBC WITH DIFFERENTIAL/PLATELET
Basophils Absolute: 0 10*3/uL (ref 0.0–0.1)
Basophils Relative: 0 %
EOS ABS: 0.2 10*3/uL (ref 0.0–0.7)
Eosinophils Relative: 2 %
HCT: 39.3 % (ref 36.0–46.0)
HEMOGLOBIN: 13.5 g/dL (ref 12.0–15.0)
LYMPHS ABS: 3.1 10*3/uL (ref 0.7–4.0)
LYMPHS PCT: 38 %
MCH: 27.2 pg (ref 26.0–34.0)
MCHC: 34.4 g/dL (ref 30.0–36.0)
MCV: 79.1 fL (ref 78.0–100.0)
MONOS PCT: 6 %
Monocytes Absolute: 0.5 10*3/uL (ref 0.1–1.0)
Neutro Abs: 4.4 10*3/uL (ref 1.7–7.7)
Neutrophils Relative %: 54 %
Platelets: 340 10*3/uL (ref 150–400)
RBC: 4.97 MIL/uL (ref 3.87–5.11)
RDW: 14.4 % (ref 11.5–15.5)
WBC: 8.2 10*3/uL (ref 4.0–10.5)

## 2016-11-25 LAB — HEPATIC FUNCTION PANEL
ALK PHOS: 100 U/L (ref 38–126)
ALT: 20 U/L (ref 14–54)
AST: 19 U/L (ref 15–41)
Albumin: 4.4 g/dL (ref 3.5–5.0)
BILIRUBIN DIRECT: 0.1 mg/dL (ref 0.1–0.5)
BILIRUBIN INDIRECT: 0.3 mg/dL (ref 0.3–0.9)
BILIRUBIN TOTAL: 0.4 mg/dL (ref 0.3–1.2)
TOTAL PROTEIN: 8.2 g/dL — AB (ref 6.5–8.1)

## 2016-11-25 LAB — BASIC METABOLIC PANEL
Anion gap: 9 (ref 5–15)
BUN: 10 mg/dL (ref 6–20)
CHLORIDE: 103 mmol/L (ref 101–111)
CO2: 25 mmol/L (ref 22–32)
CREATININE: 0.69 mg/dL (ref 0.44–1.00)
Calcium: 9.3 mg/dL (ref 8.9–10.3)
GFR calc Af Amer: >60 mL/min (ref >60)
GFR calc non Af Amer: >60 mL/min (ref >60)
Glucose, Bld: 102 mg/dL — ABNORMAL HIGH (ref 65–99)
POTASSIUM: 3.7 mmol/L (ref 3.5–5.1)
Sodium: 137 mmol/L (ref 135–145)

## 2016-11-25 LAB — URINALYSIS, ROUTINE W REFLEX MICROSCOPIC
BILIRUBIN URINE: NEGATIVE
Glucose, UA: NEGATIVE mg/dL
HGB URINE DIPSTICK: NEGATIVE
KETONES UR: NEGATIVE mg/dL
Nitrite: NEGATIVE
PROTEIN: NEGATIVE mg/dL
SPECIFIC GRAVITY, URINE: 1.009 (ref 1.005–1.030)
pH: 5.5 (ref 5.0–8.0)

## 2016-11-25 LAB — CBG MONITORING, ED: Glucose-Capillary: 111 mg/dL — ABNORMAL HIGH (ref 65–99)

## 2016-11-25 LAB — PREGNANCY, URINE: Preg Test, Ur: NEGATIVE

## 2016-11-25 MED ORDER — SODIUM CHLORIDE 0.9 % IV BOLUS (SEPSIS)
1000.0000 mL | Freq: Once | INTRAVENOUS | Status: AC
Start: 2016-11-25 — End: 2016-11-25
  Administered 2016-11-25: 1000 mL via INTRAVENOUS

## 2016-11-25 MED ORDER — MECLIZINE HCL 25 MG PO TABS
25.0000 mg | ORAL_TABLET | Freq: Once | ORAL | Status: AC
  Administered 2016-11-25: 25 mg via ORAL
  Filled 2016-11-25: qty 1

## 2016-11-25 MED ORDER — ONDANSETRON 4 MG PO TBDP: 4.0000 mg | ORAL_TABLET | Freq: Three times a day (TID) | ORAL | 0 refills | Status: DC | PRN

## 2016-11-25 MED ORDER — MECLIZINE HCL 25 MG PO TABS
25.0000 mg | ORAL_TABLET | Freq: Three times a day (TID) | ORAL | 0 refills | Status: DC | PRN
Start: 2016-11-25 — End: 2017-01-07

## 2016-11-25 MED ORDER — ONDANSETRON HCL 4 MG/2ML IJ SOLN
4.0000 mg | Freq: Once | INTRAMUSCULAR | Status: AC
  Administered 2016-11-25: 4 mg via INTRAVENOUS
  Filled 2016-11-25: qty 2

## 2016-11-25 NOTE — ED Triage Notes (Addendum)
Intermittent dizziness with N/V x 1 week. States she has not been able to drive due to visual effects of dizziness.

## 2016-11-25 NOTE — ED Notes (Signed)
Standing at 3 minutes for orthostatic vital signs: BP- 115/56   HR- 98 Pt states that she "feels dizzy standing for this long."

## 2016-11-25 NOTE — ED Notes (Signed)
States has been having dizziness and nausea since Friday. Ongoing RUQ pain due to gallstones .
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
## 2016-11-25 NOTE — ED Notes (Signed)
Patient transported to CT 

## 2016-11-25 NOTE — Discharge Instructions (Signed)
Please read instructions below. You can begin taking meclizine 3 times per day as needed for dizziness. You can take zofran every 8 hours as needed for nausea. Schedule an appointment with your primary care provider to follow up on your visit today. Return to the ER for new or concerning symptoms.

## 2016-11-25 NOTE — ED Notes (Signed)
Pt is lying flat for 10 minutes to prepare for orthostatic vital signs.

## 2016-11-25 NOTE — ED Notes (Signed)
Pt verbalizes understanding of d/c instructions and denies any further needs at this time. 

## 2016-11-25 NOTE — ED Notes (Signed)
Pt returned from CT °

## 2016-12-16 ENCOUNTER — Ambulatory Visit: Payer: Self-pay | Admitting: General Surgery

## 2016-12-16 NOTE — H&P (Signed)
History of Present Illness Axel Filler(Keymarion Bearman MD; 08/23/2016 4:56 PM) The patient is a 26 year old female who presents for evaluation of gall stones. The patient is a 26 year old female who was recently seen secondary to symptomatic cholelithiasis. Patient at this time states that she's had continued pain since her last visit. She states becoming more intense in intensity as well as frequency. She states that this skipper up at night. She states that she's tried changing her diet however continues to have pain.   Allergies (Janette Ranson, CMA; 08/23/2016 4:34 PM) Allergies Reconciled   Medication History (Janette Ranson, CMA; 08/23/2016 4:34 PM) Medications Reconciled No Current Medications    Review of Systems Axel Filler(Rosalva Neary, MD; 08/23/2016 4:56 PM) General Present- Feeling well. Not Present- Fever. Respiratory Not Present- Cough and Difficulty Breathing. Cardiovascular Not Present- Chest Pain. Gastrointestinal Present- Abdominal Pain and Nausea. Musculoskeletal Not Present- Myalgia. Neurological Not Present- Weakness.  Vitals (Janette Ranson CMA; 08/23/2016 4:35 PM) 08/23/2016 4:34 PM Weight: 197.8 lb Height: 61in Body Surface Area: 1.88 m Body Mass Index: 37.37 kg/m  Pulse: 70 (Regular)  BP: 110/70 (Sitting, Left Arm, Standard)       Physical Exam Axel Filler(Jshaun Abernathy, MD; 08/23/2016 4:56 PM) General Mental Status-Alert. General Appearance-Consistent with stated age. Hydration-Well hydrated. Voice-Normal.  Head and Neck Head-normocephalic, atraumatic with no lesions or palpable masses.  Eye Eyeball - Bilateral-Extraocular movements intact. Sclera/Conjunctiva - Bilateral-No scleral icterus.  Chest and Lung Exam Chest and lung exam reveals -quiet, even and easy respiratory effort with no use of accessory muscles. Inspection Chest Wall - Normal. Back - normal.  Cardiovascular Cardiovascular examination reveals -normal heart sounds, regular  rate and rhythm with no murmurs.  Abdomen Inspection Normal Exam - No Hernias. Palpation/Percussion Normal exam - Soft, Non Tender, No Rebound tenderness, No Rigidity (guarding) and No hepatosplenomegaly. Auscultation Normal exam - Bowel sounds normal.  Neurologic Neurologic evaluation reveals -alert and oriented x 3 with no impairment of recent or remote memory. Mental Status-Normal.  Musculoskeletal Normal Exam - Left-Upper Extremity Strength Normal and Lower Extremity Strength Normal. Normal Exam - Right-Upper Extremity Strength Normal, Lower Extremity Weakness.    Assessment & Plan Axel Filler(Daemion Mcniel MD; 08/23/2016 4:56 PM) SYMPTOMATIC CHOLELITHIASIS (K80.20) Impression: 26 year old female with symptomatic cholelithiasis  1. The patient would like to proceed to the operating for a laparoscopic cholecystectomy 2. Risks and benefits were discussed with the patient to generally include, but not limited to: infection, bleeding, possible need for post op ERCP, damage to the bile ducts, bile leak, and possible need for further surgery. Alternatives were offered and described. All questions were answered and the patient voiced understanding of the procedure and wishes to proceed at this point with a laparoscopic cholecystectomy

## 2016-12-27 ENCOUNTER — Encounter: Payer: Self-pay | Admitting: Family

## 2016-12-29 ENCOUNTER — Ambulatory Visit: Payer: BLUE CROSS/BLUE SHIELD | Admitting: Gastroenterology

## 2017-01-07 ENCOUNTER — Encounter (HOSPITAL_BASED_OUTPATIENT_CLINIC_OR_DEPARTMENT_OTHER): Payer: Self-pay | Admitting: *Deleted

## 2017-01-07 ENCOUNTER — Emergency Department (HOSPITAL_BASED_OUTPATIENT_CLINIC_OR_DEPARTMENT_OTHER): Payer: BLUE CROSS/BLUE SHIELD

## 2017-01-07 ENCOUNTER — Emergency Department (HOSPITAL_BASED_OUTPATIENT_CLINIC_OR_DEPARTMENT_OTHER)
Admission: EM | Admit: 2017-01-07 | Discharge: 2017-01-07 | Disposition: A | Discharge status: Home / Self Care | Payer: BLUE CROSS/BLUE SHIELD | Attending: Emergency Medicine | Admitting: Emergency Medicine

## 2017-01-07 DIAGNOSIS — R109 Unspecified abdominal pain: Secondary | ICD-10-CM | POA: Diagnosis present

## 2017-01-07 DIAGNOSIS — B9689 Other specified bacterial agents as the cause of diseases classified elsewhere: Secondary | ICD-10-CM

## 2017-01-07 DIAGNOSIS — N3000 Acute cystitis without hematuria: Secondary | ICD-10-CM | POA: Diagnosis not present

## 2017-01-07 DIAGNOSIS — N760 Acute vaginitis: Secondary | ICD-10-CM | POA: Diagnosis not present

## 2017-01-07 LAB — CBC
HEMATOCRIT: 38.3 % (ref 36.0–46.0)
HEMOGLOBIN: 13.1 g/dL (ref 12.0–15.0)
MCH: 26.8 pg (ref 26.0–34.0)
MCHC: 34.2 g/dL (ref 30.0–36.0)
MCV: 78.5 fL (ref 78.0–100.0)
Platelets: 304 10*3/uL (ref 150–400)
RBC: 4.88 MIL/uL (ref 3.87–5.11)
RDW: 14.1 % (ref 11.5–15.5)
WBC: 5.4 10*3/uL (ref 4.0–10.5)

## 2017-01-07 LAB — URINALYSIS, MICROSCOPIC (REFLEX)

## 2017-01-07 LAB — COMPREHENSIVE METABOLIC PANEL
ALK PHOS: 93 U/L (ref 38–126)
ALT: 19 U/L (ref 14–54)
AST: 20 U/L (ref 15–41)
Albumin: 4.1 g/dL (ref 3.5–5.0)
Anion gap: 7 (ref 5–15)
BILIRUBIN TOTAL: 0.4 mg/dL (ref 0.3–1.2)
BUN: 11 mg/dL (ref 6–20)
CALCIUM: 8.9 mg/dL (ref 8.9–10.3)
CO2: 27 mmol/L (ref 22–32)
CREATININE: 0.8 mg/dL (ref 0.44–1.00)
Chloride: 106 mmol/L (ref 101–111)
GFR calc non Af Amer: >60 mL/min (ref >60)
GLUCOSE: 109 mg/dL — AB (ref 65–99)
Potassium: 3.7 mmol/L (ref 3.5–5.1)
Sodium: 140 mmol/L (ref 135–145)
TOTAL PROTEIN: 7.8 g/dL (ref 6.5–8.1)

## 2017-01-07 LAB — WET PREP, GENITAL
Sperm: NONE SEEN
TRICH WET PREP: NONE SEEN
Yeast Wet Prep HPF POC: NONE SEEN

## 2017-01-07 LAB — URINALYSIS, ROUTINE W REFLEX MICROSCOPIC
Bilirubin Urine: NEGATIVE
GLUCOSE, UA: NEGATIVE mg/dL
HGB URINE DIPSTICK: NEGATIVE
KETONES UR: NEGATIVE mg/dL
Nitrite: NEGATIVE
PROTEIN: NEGATIVE mg/dL
Specific Gravity, Urine: 1.01 (ref 1.005–1.030)
pH: 6 (ref 5.0–8.0)

## 2017-01-07 LAB — LIPASE, BLOOD: Lipase: 23 U/L (ref 11–51)

## 2017-01-07 LAB — PREGNANCY, URINE: Preg Test, Ur: NEGATIVE

## 2017-01-07 MED ORDER — SODIUM CHLORIDE 0.9 % IV BOLUS (SEPSIS)
1000.0000 mL | Freq: Once | INTRAVENOUS | Status: AC
  Administered 2017-01-07: 1000 mL via INTRAVENOUS

## 2017-01-07 MED ORDER — ONDANSETRON HCL 4 MG/2ML IJ SOLN
4.0000 mg | Freq: Once | INTRAMUSCULAR | Status: AC
  Administered 2017-01-07: 4 mg via INTRAVENOUS
  Filled 2017-01-07: qty 2

## 2017-01-07 MED ORDER — NITROFURANTOIN MONOHYD MACRO 100 MG PO CAPS
100.0000 mg | ORAL_CAPSULE | Freq: Two times a day (BID) | ORAL | 0 refills | Status: DC
Start: 2017-01-07 — End: 2017-04-27

## 2017-01-07 MED ORDER — IBUPROFEN 800 MG PO TABS
800.0000 mg | ORAL_TABLET | Freq: Once | ORAL | Status: AC
  Administered 2017-01-07: 800 mg via ORAL
  Filled 2017-01-07: qty 1

## 2017-01-07 MED ORDER — IOPAMIDOL (ISOVUE-300) INJECTION 61%
100.0000 mL | Freq: Once | INTRAVENOUS | Status: AC | PRN
  Administered 2017-01-07: 100 mL via INTRAVENOUS

## 2017-01-07 MED ORDER — MORPHINE SULFATE (PF) 4 MG/ML IV SOLN
4.0000 mg | Freq: Once | INTRAVENOUS | Status: AC
  Administered 2017-01-07: 4 mg via INTRAVENOUS
  Filled 2017-01-07: qty 1

## 2017-01-07 MED ORDER — METRONIDAZOLE 500 MG PO TABS: 500.0000 mg | ORAL_TABLET | Freq: Two times a day (BID) | ORAL | 0 refills | Status: DC

## 2017-01-07 MED FILL — NITROFURANTOIN MONO-MCR 100: 100 | 5 days supply | Qty: 10 | Fill #0

## 2017-01-07 MED FILL — metroNIDAZOLE 500 MG TABS: 500 | 7 days supply | Qty: 14 | Fill #0

## 2017-01-07 NOTE — Discharge Instructions (Addendum)
It was a pleasure taking care of you today!  You have evidence of a urinary tract infection and bacterial vaginosis on your labs today. Please take the two antibiotics prescribed to treat these infections.   Please return to the ER for new or worsening abdominal pain, vomiting that does not stop, abdominal pain with fever or any new or worsening symptoms.
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
## 2017-01-07 NOTE — ED Triage Notes (Signed)
Pt reports her usual "gallbladder attack" pain to her ruq x Monday, pain to her llq and left flank area x Wednesday also. Reports vomiting bile this am.

## 2017-01-08 LAB — RPR: RPR Ser Ql: NONREACTIVE

## 2017-01-08 LAB — HIV ANTIBODY (ROUTINE TESTING W REFLEX): HIV SCREEN 4TH GENERATION: NONREACTIVE

## 2017-01-10 LAB — GC/CHLAMYDIA PROBE AMP (~~LOC~~) NOT AT ARMC
CHLAMYDIA, DNA PROBE: NEGATIVE
NEISSERIA GONORRHEA: NEGATIVE

## 2017-01-13 ENCOUNTER — Other Ambulatory Visit (HOSPITAL_COMMUNITY): Payer: Self-pay | Admitting: Pharmacy Technician

## 2017-01-14 NOTE — Pre-Procedure Instructions (Signed)
Rodnisha Carey  01/14/2017      [facility] - [place], [place] - [address] AT [facility] [address] [place] [address] Phone: [phone] Fax: [phone]    Your procedure is scheduled on October 3  Report to [facility] Admitting at [employer] A.M.  Call this number if you have problems the morning of surgery:  [phone]   Remember:  Do not eat food or drink liquids after midnight.  Continue all other medications as directed by your physician except follow these medication instructions before surgery   Take these medicines the morning of surgery with A SIP OF WATER  acetaminophen (TYLENOL)  nitrofurantoin, macrocrystal-monohydrate, (MACROBID) if still taking  7 days prior to surgery STOP taking any Aspirin, Aleve, Naproxen, Ibuprofen, Motrin, Advil, Goody's, BC's, all herbal medications, fish oil, and all vitamins    Do not wear jewelry, make-up or nail polish.  Do not wear lotions, powders, or perfumes, or deoderant.  Do not shave 48 hours prior to surgery.  Men may shave face and neck.  Do not bring valuables to the hospital.  [facility] is not responsible for any belongings or valuables.  Contacts, dentures or bridgework may not be worn into surgery.  Leave your suitcase in the car.  After surgery it may be brought to your room.  For patients admitted to the hospital, discharge time will be determined by your treatment team.  Patients discharged the day of surgery will not be allowed to drive home.    Special instructions:   [place]- Preparing For Surgery  Before surgery, you can play an important role. Because skin is not sterile, your skin needs to be as free of germs as possible. You can reduce the number of germs on your skin by washing with CHG (chlorahexidine gluconate) Soap before surgery.  CHG is an antiseptic cleaner which kills germs and bonds with the skin to continue killing germs even after  washing.  Please do not use if you have an allergy to CHG or antibacterial soaps. If your skin becomes reddened/irritated stop using the CHG.  Do not shave (including legs and underarms) for at least 48 hours prior to first CHG shower. It is OK to shave your face.  Please follow these instructions carefully.   1. Shower the NIGHT BEFORE SURGERY and the MORNING OF SURGERY with CHG.   2. If you chose to wash your hair, wash your hair first as usual with your normal shampoo.  3. After you shampoo, rinse your hair and body thoroughly to remove the shampoo.  4. Use CHG as you would any other liquid soap. You can apply CHG directly to the skin and wash gently with a scrungie or a clean washcloth.   5. Apply the CHG Soap to your body ONLY FROM THE NECK DOWN.  Do not use on open wounds or open sores. Avoid contact with your eyes, ears, mouth and genitals (private parts). Wash genitals (private parts) with your normal soap.  6. Wash thoroughly, paying special attention to the area where your surgery will be performed.  7. Thoroughly rinse your body with warm water from the neck down.  8. DO NOT shower/wash with your normal soap after using and rinsing off the CHG Soap.  9. Pat yourself dry with a CLEAN TOWEL.   10. Wear CLEAN PAJAMAS   11. Place CLEAN SHEETS on your bed the night of  your first shower and DO NOT SLEEP WITH PETS.    Day of Surgery: Do not apply any deodorants/lotions. Please wear clean clothes to the hospital/surgery center.      Please read over the following fact sheets that you were given.

## 2017-01-17 ENCOUNTER — Encounter (HOSPITAL_COMMUNITY): Payer: Self-pay

## 2017-01-17 ENCOUNTER — Encounter (HOSPITAL_COMMUNITY)
Admit: 2017-01-17 | Discharge: 2017-01-17 | Disposition: A | Discharge status: Home / Self Care | Payer: BLUE CROSS/BLUE SHIELD | Attending: General Surgery | Admitting: General Surgery

## 2017-01-17 DIAGNOSIS — K8010 Calculus of gallbladder with chronic cholecystitis without obstruction: Secondary | ICD-10-CM | POA: Diagnosis present

## 2017-01-17 DIAGNOSIS — Z6836 Body mass index (BMI) 36.0-36.9, adult: Secondary | ICD-10-CM | POA: Diagnosis not present

## 2017-01-17 LAB — HCG, SERUM, QUALITATIVE: PREG SERUM: NEGATIVE

## 2017-01-17 LAB — CBC
HCT: 37.7 % (ref 36.0–46.0)
Hemoglobin: 12.6 g/dL (ref 12.0–15.0)
MCH: 26.5 pg (ref 26.0–34.0)
MCHC: 33.4 g/dL (ref 30.0–36.0)
MCV: 79.2 fL (ref 78.0–100.0)
PLATELETS: 316 10*3/uL (ref 150–400)
RBC: 4.76 MIL/uL (ref 3.87–5.11)
RDW: 13.9 % (ref 11.5–15.5)
WBC: 5.7 10*3/uL (ref 4.0–10.5)

## 2017-01-17 NOTE — Progress Notes (Signed)
PCP - Sandford Craze Cardiologist - patient denies  Chest x-ray - 05/10/16 EKG - 11/25/16 Stress Test - patient denies ECHO - patient denies Cardiac Cath - patient denies  Sleep Study - patient denies   Patient denies shortness of breath, fever, cough and chest pain at PAT appointment   Patient verbalized understanding of instructions that were given to them at the PAT appointment. Patient was also instructed that they will need to review over the PAT instructions again at home before surgery.

## 2017-01-18 NOTE — Anesthesia Preprocedure Evaluation (Addendum)
Anesthesia Evaluation  Patient identified by MRN, date of birth, ID band Patient awake    Reviewed: Allergy & Precautions, NPO status , Patient's Chart, lab work & pertinent test results  Airway Mallampati: II  TM Distance: >3 FB Neck ROM: Full    Dental no notable dental hx. (+) Teeth Intact, Dental Advisory Given   Pulmonary neg pulmonary ROS,    Pulmonary exam normal breath sounds clear to auscultation       Cardiovascular negative cardio ROS Normal cardiovascular exam+ Valvular Problems/Murmurs  Rhythm:Regular Rate:Normal     Neuro/Psych  Headaches, negative neurological ROS  negative psych ROS   GI/Hepatic negative GI ROS, Neg liver ROS,   Endo/Other  negative endocrine ROSMorbid obesity  Renal/GU negative Renal ROS  negative genitourinary   Musculoskeletal negative musculoskeletal ROS (+)   Abdominal   Peds negative pediatric ROS (+)  Hematology negative hematology ROS (+)   Anesthesia Other Findings Pregnancy Urine  Pregnancy Serum  NEGATIVE 01/07/17 0940 NEGATIVE 01/17/17 0940 NEGATIVE 11/25/16 1706      Reproductive/Obstetrics negative OB ROS                           Anesthesia Physical Anesthesia Plan  ASA: II  Anesthesia Plan: General   Post-op Pain Management:    Induction: Intravenous  PONV Risk Score and Plan: 3 and Ondansetron, Dexamethasone and Treatment may vary due to age or medical condition  Airway Management Planned: Oral ETT  Additional Equipment:   Intra-op Plan:   Post-operative Plan: Extubation in OR  Informed Consent:   Plan Discussed with:   Anesthesia Plan Comments: (  )        Anesthesia Quick Evaluation

## 2017-01-19 ENCOUNTER — Ambulatory Visit (HOSPITAL_COMMUNITY): Payer: BLUE CROSS/BLUE SHIELD | Admitting: Anesthesiology

## 2017-01-19 ENCOUNTER — Ambulatory Visit (HOSPITAL_COMMUNITY)
Admission: RE | Admit: 2017-01-19 | Discharge: 2017-01-19 | Disposition: A | Discharge status: Home / Self Care | Payer: BLUE CROSS/BLUE SHIELD | Source: Ambulatory Visit | Attending: General Surgery | Admitting: General Surgery

## 2017-01-19 ENCOUNTER — Encounter (HOSPITAL_COMMUNITY)
Admission: RE | Disposition: A | Discharge status: Home / Self Care | Payer: Self-pay | Source: Ambulatory Visit | Attending: General Surgery

## 2017-01-19 ENCOUNTER — Encounter (HOSPITAL_COMMUNITY): Payer: Self-pay

## 2017-01-19 DIAGNOSIS — K8010 Calculus of gallbladder with chronic cholecystitis without obstruction: Secondary | ICD-10-CM | POA: Diagnosis not present

## 2017-01-19 DIAGNOSIS — Z6836 Body mass index (BMI) 36.0-36.9, adult: Secondary | ICD-10-CM | POA: Insufficient documentation

## 2017-01-19 HISTORY — PX: CHOLECYSTECTOMY: SHX55

## 2017-01-19 SURGERY — LAPAROSCOPIC CHOLECYSTECTOMY
Anesthesia: General | Site: Abdomen

## 2017-01-19 MED ORDER — LIDOCAINE HCL (CARDIAC) 20 MG/ML IV SOLN
INTRAVENOUS | Status: DC | PRN
  Administered 2017-01-19: 100 mg via INTRAVENOUS

## 2017-01-19 MED ORDER — SODIUM CHLORIDE 0.9 % IR SOLN
Status: DC | PRN
  Administered 2017-01-19: 1000 mL

## 2017-01-19 MED ORDER — LACTATED RINGERS IV SOLN
INTRAVENOUS | Status: DC | PRN
  Administered 2017-01-19: 08:00:00 via INTRAVENOUS

## 2017-01-19 MED ORDER — FENTANYL CITRATE (PF) 100 MCG/2ML IJ SOLN
INTRAMUSCULAR | Status: AC
  Filled 2017-01-19: qty 2

## 2017-01-19 MED ORDER — PROPOFOL 10 MG/ML IV BOLUS
INTRAVENOUS | Status: DC | PRN
  Administered 2017-01-19: 160 mg via INTRAVENOUS

## 2017-01-19 MED ORDER — CHLORHEXIDINE GLUCONATE CLOTH 2 % EX PADS: 6.0000 | MEDICATED_PAD | Freq: Once | CUTANEOUS | Status: DC

## 2017-01-19 MED ORDER — DEXAMETHASONE SODIUM PHOSPHATE 10 MG/ML IJ SOLN
INTRAMUSCULAR | Status: AC
  Filled 2017-01-19: qty 1

## 2017-01-19 MED ORDER — SODIUM CHLORIDE 0.9% FLUSH: 3.0000 mL | INTRAVENOUS | Status: DC | PRN

## 2017-01-19 MED ORDER — 0.9 % SODIUM CHLORIDE (POUR BTL) OPTIME
TOPICAL | Status: DC | PRN
  Administered 2017-01-19: 1000 mL

## 2017-01-19 MED ORDER — CEFAZOLIN SODIUM-DEXTROSE 2-4 GM/100ML-% IV SOLN
2.0000 g | INTRAVENOUS | Status: AC
  Administered 2017-01-19: 2 g via INTRAVENOUS
  Filled 2017-01-19: qty 100

## 2017-01-19 MED ORDER — PHENYLEPHRINE HCL 10 MG/ML IJ SOLN
INTRAMUSCULAR | Status: DC | PRN
  Administered 2017-01-19 (×2): 80 ug via INTRAVENOUS

## 2017-01-19 MED ORDER — FENTANYL CITRATE (PF) 100 MCG/2ML IJ SOLN
25.0000 ug | INTRAMUSCULAR | Status: DC | PRN
Start: 2017-01-19 — End: 2017-01-19
  Administered 2017-01-19 (×3): 50 ug via INTRAVENOUS

## 2017-01-19 MED ORDER — MIDAZOLAM HCL 5 MG/5ML IJ SOLN
INTRAMUSCULAR | Status: DC | PRN
  Administered 2017-01-19: 2 mg via INTRAVENOUS

## 2017-01-19 MED ORDER — PHENYLEPHRINE 40 MCG/ML (10ML) SYRINGE FOR IV PUSH (FOR BLOOD PRESSURE SUPPORT)
PREFILLED_SYRINGE | INTRAVENOUS | Status: AC
  Filled 2017-01-19: qty 10

## 2017-01-19 MED ORDER — HYDROMORPHONE HCL 1 MG/ML IJ SOLN
1.0000 mg | Freq: Once | INTRAMUSCULAR | Status: AC
  Administered 2017-01-19: 1 mg via INTRAVENOUS

## 2017-01-19 MED ORDER — MORPHINE SULFATE (PF) 2 MG/ML IV SOLN: 2.0000 mg | INTRAVENOUS | Status: DC | PRN

## 2017-01-19 MED ORDER — DEXAMETHASONE SODIUM PHOSPHATE 10 MG/ML IJ SOLN
INTRAMUSCULAR | Status: DC | PRN
  Administered 2017-01-19: 10 mg via INTRAVENOUS

## 2017-01-19 MED ORDER — PROPOFOL 10 MG/ML IV BOLUS
INTRAVENOUS | Status: AC
  Filled 2017-01-19: qty 20

## 2017-01-19 MED ORDER — BUPIVACAINE HCL 0.25 % IJ SOLN
INTRAMUSCULAR | Status: DC | PRN
  Administered 2017-01-19: 4 mL

## 2017-01-19 MED ORDER — MIDAZOLAM HCL 2 MG/2ML IJ SOLN
INTRAMUSCULAR | Status: AC
  Filled 2017-01-19: qty 2

## 2017-01-19 MED ORDER — ACETAMINOPHEN 325 MG PO TABS: 650.0000 mg | ORAL_TABLET | ORAL | Status: DC | PRN

## 2017-01-19 MED ORDER — ROCURONIUM BROMIDE 100 MG/10ML IV SOLN
INTRAVENOUS | Status: DC | PRN
  Administered 2017-01-19: 40 mg via INTRAVENOUS

## 2017-01-19 MED ORDER — SODIUM CHLORIDE 0.9% FLUSH: 3.0000 mL | Freq: Two times a day (BID) | INTRAVENOUS | Status: DC

## 2017-01-19 MED ORDER — SUGAMMADEX SODIUM 200 MG/2ML IV SOLN
INTRAVENOUS | Status: DC | PRN
  Administered 2017-01-19: 200 mg via INTRAVENOUS
  Administered 2017-01-19: 100 mg via INTRAVENOUS

## 2017-01-19 MED ORDER — ONDANSETRON HCL 4 MG/2ML IJ SOLN
INTRAMUSCULAR | Status: DC | PRN
  Administered 2017-01-19: 4 mg via INTRAVENOUS

## 2017-01-19 MED ORDER — FENTANYL CITRATE (PF) 250 MCG/5ML IJ SOLN
INTRAMUSCULAR | Status: AC
  Filled 2017-01-19: qty 5

## 2017-01-19 MED ORDER — LIDOCAINE 2% (20 MG/ML) 5 ML SYRINGE
INTRAMUSCULAR | Status: AC
  Filled 2017-01-19: qty 5

## 2017-01-19 MED ORDER — HYDROMORPHONE HCL 1 MG/ML IJ SOLN
INTRAMUSCULAR | Status: AC
  Filled 2017-01-19: qty 1

## 2017-01-19 MED ORDER — SODIUM CHLORIDE 0.9 % IV SOLN: 250.0000 mL | INTRAVENOUS | Status: DC | PRN

## 2017-01-19 MED ORDER — ONDANSETRON HCL 4 MG/2ML IJ SOLN
INTRAMUSCULAR | Status: AC
  Filled 2017-01-19: qty 2

## 2017-01-19 MED ORDER — OXYCODONE HCL 5 MG PO TABS
5.0000 mg | ORAL_TABLET | ORAL | Status: DC | PRN
  Administered 2017-01-19: 10 mg via ORAL

## 2017-01-19 MED ORDER — FENTANYL CITRATE (PF) 100 MCG/2ML IJ SOLN
INTRAMUSCULAR | Status: DC | PRN
  Administered 2017-01-19 (×2): 50 ug via INTRAVENOUS
  Administered 2017-01-19: 100 ug via INTRAVENOUS

## 2017-01-19 MED ORDER — ONDANSETRON HCL 4 MG/2ML IJ SOLN: 4.0000 mg | Freq: Once | INTRAMUSCULAR | Status: DC | PRN

## 2017-01-19 MED ORDER — BUPIVACAINE HCL (PF) 0.25 % IJ SOLN
INTRAMUSCULAR | Status: AC
  Filled 2017-01-19: qty 30

## 2017-01-19 MED ORDER — ACETAMINOPHEN 650 MG RE SUPP: 650.0000 mg | RECTAL | Status: DC | PRN

## 2017-01-19 MED ORDER — MEPERIDINE HCL 25 MG/ML IJ SOLN: 6.2500 mg | INTRAMUSCULAR | Status: DC | PRN

## 2017-01-19 MED ORDER — SUGAMMADEX SODIUM 200 MG/2ML IV SOLN
INTRAVENOUS | Status: AC
  Filled 2017-01-19: qty 2

## 2017-01-19 MED ORDER — OXYCODONE HCL 5 MG PO TABS
ORAL_TABLET | ORAL | Status: AC
  Filled 2017-01-19: qty 2

## 2017-01-19 MED ORDER — OXYCODONE-ACETAMINOPHEN 5-325 MG PO TABS: 1.0000 | ORAL_TABLET | Freq: Four times a day (QID) | ORAL | 0 refills | Status: DC | PRN

## 2017-01-19 SURGICAL SUPPLY — 41 items
BENZOIN TINCTURE PRP APPL 2/3 (GAUZE/BANDAGES/DRESSINGS) ×2 IMPLANT
CANISTER SUCT 3000ML PPV (MISCELLANEOUS) ×2 IMPLANT
CHLORAPREP W/TINT 26ML (MISCELLANEOUS) ×2 IMPLANT
CLIP VESOLOCK MED LG 6/CT (CLIP) ×2 IMPLANT
COVER SURGICAL LIGHT HANDLE (MISCELLANEOUS) ×2 IMPLANT
COVER TRANSDUCER ULTRASND (DRAPES) ×2 IMPLANT
ELECT REM PT RETURN 9FT ADLT (ELECTROSURGICAL) ×2
ELECTRODE REM PT RTRN 9FT ADLT (ELECTROSURGICAL) ×1 IMPLANT
GAUZE SPONGE 2X2 8PLY STRL LF (GAUZE/BANDAGES/DRESSINGS) ×1 IMPLANT
GLOVE BIO SURGEON STRL SZ7.5 (GLOVE) ×2 IMPLANT
GLOVE BIOGEL PI IND STRL 7.5 (GLOVE) ×1 IMPLANT
GLOVE BIOGEL PI IND STRL 8 (GLOVE) ×1 IMPLANT
GLOVE BIOGEL PI INDICATOR 7.5 (GLOVE) ×1
GLOVE BIOGEL PI INDICATOR 8 (GLOVE) ×1
GLOVE ECLIPSE 7.5 STRL STRAW (GLOVE) ×2 IMPLANT
GLOVE ECLIPSE 8.0 STRL XLNG CF (GLOVE) ×2 IMPLANT
GOWN STRL REUS W/ TWL LRG LVL3 (GOWN DISPOSABLE) ×1 IMPLANT
GOWN STRL REUS W/ TWL XL LVL3 (GOWN DISPOSABLE) ×2 IMPLANT
GOWN STRL REUS W/TWL LRG LVL3 (GOWN DISPOSABLE) ×1
GOWN STRL REUS W/TWL XL LVL3 (GOWN DISPOSABLE) ×2
GRASPER SUT TROCAR 14GX15 (MISCELLANEOUS) ×2 IMPLANT
KIT BASIN OR (CUSTOM PROCEDURE TRAY) ×2 IMPLANT
KIT ROOM TURNOVER OR (KITS) ×2 IMPLANT
NEEDLE INSUFFLATION 14GA 120MM (NEEDLE) ×2 IMPLANT
NS IRRIG 1000ML POUR BTL (IV SOLUTION) ×2 IMPLANT
PAD ARMBOARD 7.5X6 YLW CONV (MISCELLANEOUS) ×4 IMPLANT
POUCH RETRIEVAL ECOSAC 10 (ENDOMECHANICALS) IMPLANT
POUCH RETRIEVAL ECOSAC 10MM (ENDOMECHANICALS)
SCISSORS LAP 5X35 DISP (ENDOMECHANICALS) ×2 IMPLANT
SET IRRIG TUBING LAPAROSCOPIC (IRRIGATION / IRRIGATOR) ×2 IMPLANT
SLEEVE ENDOPATH XCEL 5M (ENDOMECHANICALS) ×2 IMPLANT
SPECIMEN JAR SMALL (MISCELLANEOUS) ×2 IMPLANT
SPONGE GAUZE 2X2 STER 10/PKG (GAUZE/BANDAGES/DRESSINGS) ×1
STRIP CLOSURE SKIN 1/2X4 (GAUZE/BANDAGES/DRESSINGS) ×2 IMPLANT
SUT MNCRL AB 3-0 PS2 18 (SUTURE) ×2 IMPLANT
TOWEL OR 17X24 6PK STRL BLUE (TOWEL DISPOSABLE) ×2 IMPLANT
TOWEL OR 17X26 10 PK STRL BLUE (TOWEL DISPOSABLE) ×2 IMPLANT
TRAY LAPAROSCOPIC MC (CUSTOM PROCEDURE TRAY) ×2 IMPLANT
TROCAR XCEL NON-BLD 11X100MML (ENDOMECHANICALS) ×2 IMPLANT
TROCAR XCEL NON-BLD 5MMX100MML (ENDOMECHANICALS) ×2 IMPLANT
TUBING INSUFFLATION (TUBING) ×2 IMPLANT

## 2017-01-19 NOTE — Anesthesia Procedure Notes (Signed)
Procedure Name: Intubation Date/Time: 01/19/2017 8:30 AM Performed by: Lovie Chol Pre-anesthesia Checklist: Patient identified, Emergency Drugs available, Suction available and Patient being monitored Patient Re-evaluated:Patient Re-evaluated prior to induction Oxygen Delivery Method: Circle System Utilized Preoxygenation: Pre-oxygenation with 100% oxygen Induction Type: IV induction Ventilation: Mask ventilation without difficulty Laryngoscope Size: Miller and 2 Grade View: Grade I Tube type: Oral Tube size: 7.0 mm Number of attempts: 1 Airway Equipment and Method: Stylet and Oral airway Placement Confirmation: ETT inserted through vocal cords under direct vision,  positive ETCO2 and breath sounds checked- equal and bilateral Secured at: 20 cm Tube secured with: Tape Dental Injury: Teeth and Oropharynx as per pre-operative assessment

## 2017-01-19 NOTE — Anesthesia Postprocedure Evaluation (Signed)
Anesthesia Post Note  Patient: Erika Woodard  Procedure(s) Performed: LAPAROSCOPIC CHOLECYSTECTOMY (N/A Abdomen)     Patient location during evaluation: PACU Anesthesia Type: General Level of consciousness: awake and alert Pain management: pain level controlled Vital Signs Assessment: post-procedure vital signs reviewed and stable Respiratory status: spontaneous breathing, nonlabored ventilation, respiratory function stable and patient connected to nasal cannula oxygen Cardiovascular status: blood pressure returned to baseline and stable Postop Assessment: no apparent nausea or vomiting Anesthetic complications: no    Last Vitals:  Vitals:   01/19/17 0701 01/19/17 0922  BP: (!) 103/47 (!) 115/47  Pulse: 67 69  Resp: 18 13  Temp: 36.6 C 36.5 C  SpO2: 99% 100%    Last Pain:  Vitals:   01/19/17 0922  TempSrc:   PainSc: 4                  Adaora Mchaney

## 2017-01-19 NOTE — Transfer of Care (Signed)
Immediate Anesthesia Transfer of Care Note  Patient: Erika Woodard  Procedure(s) Performed: LAPAROSCOPIC CHOLECYSTECTOMY (N/A Abdomen)  Patient Location: PACU  Anesthesia Type:General  Level of Consciousness: awake, oriented and patient cooperative  Airway & Oxygen Therapy: Patient Spontanous Breathing and Patient connected to nasal cannula oxygen  Post-op Assessment: Report given to RN and Post -op Vital signs reviewed and stable  Post vital signs: Reviewed  Last Vitals:  Vitals:   01/19/17 0701  BP: (!) 103/47  Pulse: 67  Resp: 18  Temp: 36.6 C  SpO2: 99%    Last Pain:  Vitals:   01/19/17 0701  TempSrc: Oral      Patients Stated Pain Goal: 1 (01/19/17 0700)  Complications: No apparent anesthesia complications

## 2017-01-19 NOTE — Discharge Instructions (Signed)
[facility] ______[facility] °LAPAROSCOPIC SURGERY: POST OP INSTRUCTIONS °Always review your discharge instruction sheet given to you by the facility where your surgery was performed. °IF YOU HAVE DISABILITY OR FAMILY LEAVE FORMS, YOU MUST BRING THEM TO THE OFFICE FOR PROCESSING.   °DO NOT GIVE THEM TO YOUR DOCTOR. ° °1. A prescription for pain medication may be given to you upon discharge.  Take your pain medication as prescribed, if needed.  If narcotic pain medicine is not needed, then you may take acetaminophen (Tylenol) or ibuprofen (Advil) as needed. °2. Take your usually prescribed medications unless otherwise directed. °3. If you need a refill on your pain medication, please contact your pharmacy.  They will contact our office to request authorization. Prescriptions will not be filled after 5pm or on week-ends. °4. You should follow a light diet the first few days after arrival home, such as soup and crackers, etc.  Be sure to include lots of fluids daily. °5. Most patients will experience some swelling and bruising in the area of the incisions.  Ice packs will help.  Swelling and bruising can take several days to resolve.  °6. It is common to experience some constipation if taking pain medication after surgery.  Increasing fluid intake and taking a stool softener (such as Colace) will usually help or prevent this problem from occurring.  A mild laxative (Milk of Magnesia or Miralax) should be taken according to package instructions if there are no bowel movements after 48 hours. °7. Unless discharge instructions indicate otherwise, you may remove your bandages 24-48 hours after surgery, and you may shower at that time.  You may have steri-strips (small skin tapes) in place directly over the incision.  These strips should be left on the skin for 7-10 days.  If your surgeon used skin glue on the incision, you may shower in 24 hours.  The glue will flake off over the next 2-3 weeks.  Any sutures or  staples will be removed at the office during your follow-up visit. °8. ACTIVITIES:  You may resume regular (light) daily activities beginning the next day--such as daily self-care, walking, climbing stairs--gradually increasing activities as tolerated.  You may have sexual intercourse when it is comfortable.  Refrain from any heavy lifting or straining until approved by your doctor. °a. You may drive when you are no longer taking prescription pain medication, you can comfortably wear a seatbelt, and you can safely maneuver your car and apply brakes. °b. RETURN TO WORK:  __________________________________________________________ °9. You should see your doctor in the office for a follow-up appointment approximately 2-3 weeks after your surgery.  Make sure that you call for this appointment within a day or two after you arrive home to insure a convenient appointment time. °10. OTHER INSTRUCTIONS: __________________________________________________________________________________________________________________________ __________________________________________________________________________________________________________________________ °WHEN TO CALL YOUR DOCTOR: °1. Fever over 101.0 °2. Inability to urinate °3. Continued bleeding from incision. °4. Increased pain, redness, or drainage from the incision. °5. Increasing abdominal pain ° °The clinic staff is available to answer your questions during regular business hours.  Please don’t hesitate to call and ask to speak to one of the nurses for clinical concerns.  If you have a medical emergency, go to the nearest emergency room or call 911.  A surgeon from [facility] is always on call at the hospital. °[address], [address], [place], [place]  [address] ? [address] [address], [place], [place]   [address] °[phone] ? [phone] ? FAX [phone] °Web site:   [email or web address] °

## 2017-01-19 NOTE — Op Note (Signed)
01/19/2017  8:58 AM  PATIENT:  Erika Woodard  26 y.o. female  PRE-OPERATIVE DIAGNOSIS:  gallstones  POST-OPERATIVE DIAGNOSIS:  Gallstones, chronic cholecystitis  PROCEDURE:  Procedure(s): LAPAROSCOPIC CHOLECYSTECTOMY (N/A)  SURGEON:  Surgeon(s) and Role:    * Axel Filler, MD - Primary  ANESTHESIA:   local and general  EBL:  Total I/O In: 800 [I.V.:800] Out: 8 [Blood:8]  BLOOD ADMINISTERED:none  DRAINS: none   LOCAL MEDICATIONS USED:  BUPIVICAINE   SPECIMEN:  Source of Specimen:  gallbladder  DISPOSITION OF SPECIMEN:  PATHOLOGY  COUNTS:  YES  TOURNIQUET:  * No tourniquets in log *  DICTATION: .Dragon Dictation  The patient was taken to the operating and placed in the supine position with bilateral SCDs in place. The patient was prepped and draped in the usual sterile fashion. A time out was called and all facts were verified. A pneumoperitoneum was obtained via A Veress needle technique to a pressure of 14mm of mercury.  A 5mm trochar was then placed in the right upper quadrant under visualization, and there were no injuries to any abdominal organs. A 11 mm port was then placed in the umbilical region after infiltrating with local anesthesia under direct visualization. A second and third epigastric port and right lower quadrant port placement under direct visualization, respectively. The liver was very fatty and large. The gallbladder was identified and retracted, the peritoneum was then sharply dissected from the gallbladder and this dissection was carried down to Calot's triangle. The gallbladder was identified and stripped away circumferentially and seen going into the gallbladder 360, the critical angle was obtained.  2 clips were placed proximally one distally and the cystic duct transected. The cystic artery was identified and 2 clips placed proximally and one distally and transected. We then proceeded to remove the gallbladder off the hepatic fossa with Bovie  cautery. A retrieval bag was then placed in the abdomen and gallbladder placed in the bag. The hepatic fossa was then reexamined and hemostasis was achieved with Bovie cautery and was excellent at the end of the case. The subhepatic fossa and perihepatic fossa was then irrigated until the effluent was clear.  The gallbladder and bag were removed from the abdominal cavity. The 11 mm trocar fascia was reapproximated with the Endo Close #1 Vicryl x2. The pneumoperitoneum was evacuated and all trochars removed under direct visulalization. The skin was then closed with 4-0 Monocryl and the skin dressed with Steri-Strips, gauze, and tape. The patient was awaken from general anesthesia and taken to the recovery room in stable condition.   PLAN OF CARE: Discharge to home after PACU  PATIENT DISPOSITION:  PACU - hemodynamically stable.   Delay start of Pharmacological VTE agent (>24hrs) due to surgical blood loss or risk of bleeding: not applicable

## 2017-01-19 NOTE — H&P (Signed)
History of Present Illness The patient is a 26 year old female who presents for evaluation of gall stones. The patient is a 26 year old female who was recently seen secondary to symptomatic cholelithiasis. Patient at this time states that she's had continued pain since her last visit. She states becoming more intense in intensity as well as frequency. She states that this skipper up at night. She states that she's tried changing her diet however continues to have pain.   Allergies  Allergies Reconciled   Medication History Medications Reconciled No Current Medications    Review of Systems  General Present- Feeling well. Not Present- Fever. Respiratory Not Present- Cough and Difficulty Breathing. Cardiovascular Not Present- Chest Pain. Gastrointestinal Present- Abdominal Pain and Nausea. Musculoskeletal Not Present- Myalgia. Neurological Not Present- Weakness.  BP (!) 103/47   Pulse 67   Temp 97.8 F (36.6 C) (Oral)   Resp 18   Ht  (1.549 m)   Wt 88.3 kg (194 lb 9.6 oz)   SpO2 99%   BMI 36.77 kg/m     Physical Exam  General Mental Status-Alert. General Appearance-Consistent with stated age. Hydration-Well hydrated. Voice-Normal.  Head and Neck Head-normocephalic, atraumatic with no lesions or palpable masses.  Eye Eyeball - Bilateral-Extraocular movements intact. Sclera/Conjunctiva - Bilateral-No scleral icterus.  Chest and Lung Exam Chest and lung exam reveals -quiet, even and easy respiratory effort with no use of accessory muscles. Inspection Chest Wall - Normal. Back - normal.  Cardiovascular Cardiovascular examination reveals -normal heart sounds, regular rate and rhythm with no murmurs.  Abdomen Inspection Normal Exam - No Hernias. Palpation/Percussion Normal exam - Soft, Non Tender, No Rebound tenderness, No Rigidity (guarding) and No hepatosplenomegaly. Auscultation Normal exam - Bowel sounds  normal.  Neurologic Neurologic evaluation reveals -alert and oriented x 3 with no impairment of recent or remote memory. Mental Status-Normal.  Musculoskeletal Normal Exam - Left-Upper Extremity Strength Normal and Lower Extremity Strength Normal. Normal Exam - Right-Upper Extremity Strength Normal, Lower Extremity Weakness.    Assessment & Plan  SYMPTOMATIC CHOLELITHIASIS (K80.20) Impression: 26 year old female with symptomatic cholelithiasis  1. The patient would like to proceed to the operating for a laparoscopic cholecystectomy 2. Risks and benefits were discussed with the patient to generally include, but not limited to: infection, bleeding, possible need for post op ERCP, damage to the bile ducts, bile leak, and possible need for further surgery. Alternatives were offered and described. All questions were answered and the patient voiced understanding of the procedure and wishes to proceed at this point with a laparoscopic cholecystectomy

## 2017-01-20 ENCOUNTER — Encounter (HOSPITAL_COMMUNITY): Payer: Self-pay | Admitting: General Surgery

## 2017-04-27 ENCOUNTER — Emergency Department (HOSPITAL_BASED_OUTPATIENT_CLINIC_OR_DEPARTMENT_OTHER)
Admission: EM | Admit: 2017-04-27 | Discharge: 2017-04-27 | Disposition: A | Discharge status: Home / Self Care | Payer: BLUE CROSS/BLUE SHIELD | Attending: Emergency Medicine | Admitting: Emergency Medicine

## 2017-04-27 ENCOUNTER — Encounter (HOSPITAL_BASED_OUTPATIENT_CLINIC_OR_DEPARTMENT_OTHER): Payer: Self-pay | Admitting: Emergency Medicine

## 2017-04-27 ENCOUNTER — Emergency Department (HOSPITAL_BASED_OUTPATIENT_CLINIC_OR_DEPARTMENT_OTHER): Payer: BLUE CROSS/BLUE SHIELD

## 2017-04-27 ENCOUNTER — Other Ambulatory Visit: Payer: Self-pay

## 2017-04-27 DIAGNOSIS — R197 Diarrhea, unspecified: Secondary | ICD-10-CM | POA: Insufficient documentation

## 2017-04-27 DIAGNOSIS — R519 Headache, unspecified: Secondary | ICD-10-CM

## 2017-04-27 DIAGNOSIS — R51 Headache: Secondary | ICD-10-CM | POA: Diagnosis not present

## 2017-04-27 DIAGNOSIS — R112 Nausea with vomiting, unspecified: Secondary | ICD-10-CM | POA: Diagnosis not present

## 2017-04-27 LAB — COMPREHENSIVE METABOLIC PANEL
ALK PHOS: 96 U/L (ref 38–126)
ALT: 19 U/L (ref 14–54)
ANION GAP: 7 (ref 5–15)
AST: 22 U/L (ref 15–41)
Albumin: 4 g/dL (ref 3.5–5.0)
BILIRUBIN TOTAL: 0.3 mg/dL (ref 0.3–1.2)
BUN: 11 mg/dL (ref 6–20)
CALCIUM: 8.7 mg/dL — AB (ref 8.9–10.3)
CO2: 25 mmol/L (ref 22–32)
CREATININE: 0.77 mg/dL (ref 0.44–1.00)
Chloride: 106 mmol/L (ref 101–111)
Glucose, Bld: 110 mg/dL — ABNORMAL HIGH (ref 65–99)
Potassium: 3.9 mmol/L (ref 3.5–5.1)
Sodium: 138 mmol/L (ref 135–145)
TOTAL PROTEIN: 7.5 g/dL (ref 6.5–8.1)

## 2017-04-27 LAB — CBC
HEMATOCRIT: 38.8 % (ref 36.0–46.0)
HEMOGLOBIN: 13.4 g/dL (ref 12.0–15.0)
MCH: 27.6 pg (ref 26.0–34.0)
MCHC: 34.5 g/dL (ref 30.0–36.0)
MCV: 80 fL (ref 78.0–100.0)
Platelets: 312 10*3/uL (ref 150–400)
RBC: 4.85 MIL/uL (ref 3.87–5.11)
RDW: 14.3 % (ref 11.5–15.5)
WBC: 6.1 10*3/uL (ref 4.0–10.5)

## 2017-04-27 LAB — URINALYSIS, ROUTINE W REFLEX MICROSCOPIC

## 2017-04-27 LAB — PREGNANCY, URINE: PREG TEST UR: NEGATIVE

## 2017-04-27 LAB — URINALYSIS, MICROSCOPIC (REFLEX)

## 2017-04-27 LAB — LIPASE, BLOOD: LIPASE: 23 U/L (ref 11–51)

## 2017-04-27 MED ORDER — PROCHLORPERAZINE EDISYLATE 5 MG/ML IJ SOLN
5.0000 mg | Freq: Once | INTRAMUSCULAR | Status: AC
  Administered 2017-04-27: 5 mg via INTRAVENOUS
  Filled 2017-04-27: qty 2

## 2017-04-27 MED ORDER — PROMETHAZINE HCL 25 MG PO TABS: 25.0000 mg | ORAL_TABLET | Freq: Four times a day (QID) | ORAL | 0 refills | Status: DC | PRN

## 2017-04-27 MED ORDER — DIPHENHYDRAMINE HCL 50 MG/ML IJ SOLN
25.0000 mg | Freq: Once | INTRAMUSCULAR | Status: AC
  Administered 2017-04-27: 25 mg via INTRAVENOUS
  Filled 2017-04-27: qty 1

## 2017-04-27 MED ORDER — SODIUM CHLORIDE 0.9 % IV BOLUS (SEPSIS)
1000.0000 mL | Freq: Once | INTRAVENOUS | Status: AC
  Administered 2017-04-27: 1000 mL via INTRAVENOUS

## 2017-04-27 MED ORDER — KETOROLAC TROMETHAMINE 30 MG/ML IJ SOLN
15.0000 mg | Freq: Once | INTRAMUSCULAR | Status: AC
  Administered 2017-04-27: 15 mg via INTRAVENOUS
  Filled 2017-04-27: qty 1

## 2017-04-27 MED FILL — PROMETHAZINE 25 MG TABLET: 25 | 3 days supply | Qty: 12 | Fill #0
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
## 2017-04-27 NOTE — Discharge Instructions (Signed)

## 2017-04-27 NOTE — ED Triage Notes (Signed)
Pt states she has been having a migraine for four days.  Pt states she has double vision which is different than usual migraine.  Pt also states she has N/V/D for 3 days.  This am she started having chest pain, left sided since yesterday, worsening this am.  No sob noted.  No diaphoresis.

## 2017-04-27 NOTE — ED Notes (Signed)
ED Provider at bedside. 

## 2017-05-18 ENCOUNTER — Encounter (HOSPITAL_BASED_OUTPATIENT_CLINIC_OR_DEPARTMENT_OTHER): Payer: Self-pay

## 2017-05-18 ENCOUNTER — Emergency Department (HOSPITAL_BASED_OUTPATIENT_CLINIC_OR_DEPARTMENT_OTHER): Payer: BLUE CROSS/BLUE SHIELD

## 2017-05-18 ENCOUNTER — Other Ambulatory Visit: Payer: Self-pay

## 2017-05-18 ENCOUNTER — Emergency Department (HOSPITAL_BASED_OUTPATIENT_CLINIC_OR_DEPARTMENT_OTHER)
Admission: EM | Admit: 2017-05-18 | Discharge: 2017-05-18 | Disposition: A | Discharge status: Home / Self Care | Payer: BLUE CROSS/BLUE SHIELD | Attending: Emergency Medicine | Admitting: Emergency Medicine

## 2017-05-18 DIAGNOSIS — R509 Fever, unspecified: Secondary | ICD-10-CM | POA: Insufficient documentation

## 2017-05-18 DIAGNOSIS — R112 Nausea with vomiting, unspecified: Secondary | ICD-10-CM | POA: Diagnosis not present

## 2017-05-18 DIAGNOSIS — R0789 Other chest pain: Secondary | ICD-10-CM

## 2017-05-18 DIAGNOSIS — R197 Diarrhea, unspecified: Secondary | ICD-10-CM | POA: Diagnosis not present

## 2017-05-18 DIAGNOSIS — R109 Unspecified abdominal pain: Secondary | ICD-10-CM | POA: Diagnosis not present

## 2017-05-18 LAB — COMPREHENSIVE METABOLIC PANEL
ALT: 20 U/L (ref 14–54)
AST: 20 U/L (ref 15–41)
Albumin: 4.2 g/dL (ref 3.5–5.0)
Alkaline Phosphatase: 106 U/L (ref 38–126)
Anion gap: 9 (ref 5–15)
BUN: 10 mg/dL (ref 6–20)
CO2: 25 mmol/L (ref 22–32)
Calcium: 8.8 mg/dL — ABNORMAL LOW (ref 8.9–10.3)
Chloride: 103 mmol/L (ref 101–111)
Creatinine, Ser: 0.69 mg/dL (ref 0.44–1.00)
GFR calc Af Amer: >60 mL/min (ref >60)
GFR calc non Af Amer: >60 mL/min (ref >60)
Glucose, Bld: 102 mg/dL — ABNORMAL HIGH (ref 65–99)
Potassium: 3.8 mmol/L (ref 3.5–5.1)
Sodium: 137 mmol/L (ref 135–145)
Total Bilirubin: 0.3 mg/dL (ref 0.3–1.2)
Total Protein: 7.9 g/dL (ref 6.5–8.1)

## 2017-05-18 LAB — CBC WITH DIFFERENTIAL/PLATELET
Basophils Absolute: 0 10*3/uL (ref 0.0–0.1)
Basophils Relative: 0 %
Eosinophils Absolute: 0 10*3/uL (ref 0.0–0.7)
Eosinophils Relative: 1 %
HCT: 40.8 % (ref 36.0–46.0)
Hemoglobin: 13.8 g/dL (ref 12.0–15.0)
Lymphocytes Relative: 23 %
Lymphs Abs: 1.2 10*3/uL (ref 0.7–4.0)
MCH: 27.3 pg (ref 26.0–34.0)
MCHC: 33.8 g/dL (ref 30.0–36.0)
MCV: 80.8 fL (ref 78.0–100.0)
Monocytes Absolute: 0.3 10*3/uL (ref 0.1–1.0)
Monocytes Relative: 5 %
Neutro Abs: 3.9 10*3/uL (ref 1.7–7.7)
Neutrophils Relative %: 71 %
Platelets: 291 10*3/uL (ref 150–400)
RBC: 5.05 MIL/uL (ref 3.87–5.11)
RDW: 13.9 % (ref 11.5–15.5)
WBC: 5.4 10*3/uL (ref 4.0–10.5)

## 2017-05-18 LAB — URINALYSIS, ROUTINE W REFLEX MICROSCOPIC
Bilirubin Urine: NEGATIVE
Glucose, UA: NEGATIVE mg/dL
Hgb urine dipstick: NEGATIVE
Ketones, ur: NEGATIVE mg/dL
Leukocytes, UA: NEGATIVE
Nitrite: NEGATIVE
Protein, ur: NEGATIVE mg/dL
Specific Gravity, Urine: 1.01 (ref 1.005–1.030)
pH: 7 (ref 5.0–8.0)

## 2017-05-18 LAB — C DIFFICILE QUICK SCREEN W PCR REFLEX
C Diff antigen: NEGATIVE
C Diff interpretation: NOT DETECTED
C Diff toxin: NEGATIVE

## 2017-05-18 LAB — LIPASE, BLOOD: Lipase: 25 U/L (ref 11–51)

## 2017-05-18 LAB — TROPONIN I: Troponin I: <0.03 ng/mL (ref <0.03)

## 2017-05-18 LAB — PREGNANCY, URINE: Preg Test, Ur: NEGATIVE

## 2017-05-18 MED ORDER — SODIUM CHLORIDE 0.9 % IV BOLUS (SEPSIS)
1000.0000 mL | Freq: Once | INTRAVENOUS | Status: AC
  Administered 2017-05-18: 1000 mL via INTRAVENOUS

## 2017-05-18 MED ORDER — ONDANSETRON 4 MG PO TBDP: 4.0000 mg | ORAL_TABLET | Freq: Three times a day (TID) | ORAL | 0 refills | Status: DC | PRN

## 2017-05-18 MED ORDER — ONDANSETRON HCL 4 MG/2ML IJ SOLN
4.0000 mg | Freq: Once | INTRAMUSCULAR | Status: AC
  Administered 2017-05-18: 4 mg via INTRAVENOUS
  Filled 2017-05-18: qty 2

## 2017-05-18 MED FILL — ONDANSETRON ODT 4 MG TABLET: 4 | 5 days supply | Qty: 15 | Fill #0

## 2017-05-18 NOTE — Discharge Instructions (Signed)
1. Medications: Take Zofran for nausea.  Wait around 20 minutes after he takes his medication before having something to eat or drink. Alternate 600 mg of ibuprofen and [phone] mg of Tylenol every 3 hours as needed for pain/fever for the next 3 days. Do not exceed 4000 mg of Tylenol daily. 2. Treatment: rest, drink plenty of fluids and get plenty of rest.  Advance the diet slowly.  Eat a diet of bland foods that will not upset your stomach such as broth, mashed potatoes, and plain chicken. 3. Follow Up: Please followup with your primary doctor in 3 days for discussion of your diagnoses and further evaluation after today's visit; if you do not have a primary care doctor use the resource guide provided to find one; Please return to the ER for any concerning signs or symptoms such as fever not controlled by ibuprofen or Tylenol, severe worsening abdominal pain, not being able to keep any food or drink down, or blood in the urine or stool.

## 2017-05-18 NOTE — ED Triage Notes (Signed)
C/o n/v/d x 24 hours-NAD-steady gait

## 2017-05-18 NOTE — ED Notes (Signed)
Patient transported to X-ray 
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
## 2017-05-19 ENCOUNTER — Telehealth (HOSPITAL_BASED_OUTPATIENT_CLINIC_OR_DEPARTMENT_OTHER): Payer: Self-pay | Admitting: Emergency Medicine

## 2017-05-19 NOTE — ED Provider Notes (Signed)
Stool studies negative for C. Diff but positive for Enteropathogenic E. Coli and Norovirus. Will prescribe cipro 500mg  BID for 3 days to pharmacy of choice.       Jeanie SewerFawze, Syreeta Figler A, PA-C 05/19/17 1714    Pricilla LovelessGoldston, Scott, MD 05/20/17 [phone]

## 2017-05-20 LAB — GASTROINTESTINAL PANEL BY PCR, STOOL (REPLACES STOOL CULTURE)

## 2017-10-26 ENCOUNTER — Encounter: Payer: Self-pay | Admitting: Family

## 2017-10-26 ENCOUNTER — Ambulatory Visit: Payer: BLUE CROSS/BLUE SHIELD | Admitting: Family

## 2017-10-26 VITALS — BP 126/74 | HR 70 | Temp 98.8°F | Resp 16 | Wt 198.0 lb

## 2017-10-26 DIAGNOSIS — G43911 Migraine, unspecified, intractable, with status migrainosus: Secondary | ICD-10-CM | POA: Diagnosis not present

## 2017-10-26 MED ORDER — SUMATRIPTAN SUCCINATE 50 MG PO TABS: ORAL_TABLET | ORAL | 2 refills | Status: DC

## 2017-10-26 MED ORDER — ONDANSETRON 4 MG PO TBDP: 4.0000 mg | ORAL_TABLET | Freq: Three times a day (TID) | ORAL | 0 refills | Status: DC | PRN
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
## 2017-10-26 NOTE — Patient Instructions (Signed)
You may use imitrex and toradol as needed for migraine. You may use zofran as needed for nausea.  You should be contacted about your referral to neurology. Please call if new/worsening symptoms or if symptoms do not improve.

## 2017-11-22 ENCOUNTER — Other Ambulatory Visit: Payer: Self-pay

## 2017-11-22 ENCOUNTER — Emergency Department (HOSPITAL_BASED_OUTPATIENT_CLINIC_OR_DEPARTMENT_OTHER)
Admission: EM | Admit: 2017-11-22 | Discharge: 2017-11-22 | Disposition: A | Discharge status: Home / Self Care | Payer: BLUE CROSS/BLUE SHIELD | Attending: Emergency Medicine | Admitting: Emergency Medicine

## 2017-11-22 ENCOUNTER — Encounter (HOSPITAL_BASED_OUTPATIENT_CLINIC_OR_DEPARTMENT_OTHER): Payer: Self-pay | Admitting: *Deleted

## 2017-11-22 DIAGNOSIS — R519 Headache, unspecified: Secondary | ICD-10-CM

## 2017-11-22 DIAGNOSIS — Z79899 Other long term (current) drug therapy: Secondary | ICD-10-CM | POA: Insufficient documentation

## 2017-11-22 DIAGNOSIS — R51 Headache: Secondary | ICD-10-CM | POA: Insufficient documentation

## 2017-11-22 MED ORDER — KETOROLAC TROMETHAMINE 15 MG/ML IJ SOLN
15.0000 mg | Freq: Once | INTRAMUSCULAR | Status: AC
  Administered 2017-11-22: 15 mg via INTRAVENOUS
  Filled 2017-11-22: qty 1

## 2017-11-22 MED ORDER — DIPHENHYDRAMINE HCL 50 MG/ML IJ SOLN
25.0000 mg | Freq: Once | INTRAMUSCULAR | Status: AC
  Administered 2017-11-22: 25 mg via INTRAVENOUS
  Filled 2017-11-22: qty 1

## 2017-11-22 MED ORDER — ONDANSETRON 4 MG PO TBDP: 4.0000 mg | ORAL_TABLET | Freq: Three times a day (TID) | ORAL | 0 refills | Status: DC | PRN

## 2017-11-22 MED ORDER — SODIUM CHLORIDE 0.9 % IV BOLUS
500.0000 mL | Freq: Once | INTRAVENOUS | Status: AC
  Administered 2017-11-22: 500 mL via INTRAVENOUS

## 2017-11-22 MED ORDER — SUMATRIPTAN SUCCINATE 50 MG PO TABS: 50.0000 mg | ORAL_TABLET | ORAL | 0 refills | Status: DC | PRN

## 2017-11-22 MED ORDER — DEXAMETHASONE SODIUM PHOSPHATE 10 MG/ML IJ SOLN
10.0000 mg | Freq: Once | INTRAMUSCULAR | Status: AC
  Administered 2017-11-22: 10 mg via INTRAVENOUS
  Filled 2017-11-22: qty 1

## 2017-11-22 MED ORDER — METOCLOPRAMIDE HCL 5 MG/ML IJ SOLN
10.0000 mg | Freq: Once | INTRAMUSCULAR | Status: AC
  Administered 2017-11-22: 10 mg via INTRAVENOUS
  Filled 2017-11-22: qty 2

## 2017-11-22 NOTE — Discharge Instructions (Addendum)
Please read and follow all provided instructions.  Your diagnoses today include:  1. Bad headache     Tests performed today include: CT of your head which was normal and did not show any serious cause of your headache Vital signs. See below for your results today.   Medications:  In the Emergency Department you received: Reglan - antinausea/headache medication Benadryl - antihistamine to counteract potential side effects of reglan Decadron - steroid medication to help prevent recurrence of headache  Toradol - NSAID medication similar to ibuprofen  Take any prescribed medications only as directed.  Additional information:  Follow any educational materials contained in this packet.  You are having a headache. No specific cause was found today for your headache. It may have been a migraine or other cause of headache. Stress, anxiety, fatigue, and depression are common triggers for headaches.   Your headache today does not appear to be life-threatening or require hospitalization, but often the exact cause of headaches is not determined in the emergency department. Therefore, follow-up with your doctor is very important to find out what may have caused your headache and whether or not you need any further diagnostic testing or treatment.   Sometimes headaches can appear benign (not harmful), but then more serious symptoms can develop which should prompt an immediate re-evaluation by your doctor or the emergency department.  BE VERY CAREFUL not to take multiple medicines containing Tylenol (also called acetaminophen). Doing so can lead to an overdose which can damage your liver and cause liver failure and possibly death.   Follow-up instructions: Please follow-up with your neurologist as planned and your PCP as needed for further evaluation of your symptoms.   Return instructions:  Please return to the Emergency Department if you experience worsening symptoms. Return if the medications  do not resolve your headache, if it recurs, or if you have multiple episodes of vomiting or cannot keep down fluids. Return if you have a change from the usual headache. RETURN IMMEDIATELY IF you: Develop a sudden, severe headache Develop confusion or become poorly responsive or faint Develop a fever above 100.63F or problem breathing Have a change in speech, vision, swallowing, or understanding Develop new weakness, numbness, tingling, incoordination in your arms or legs Have a seizure Please return if you have any other emergent concerns.  Additional Information:  Your vital signs today were: BP (!) 108/57 (BP Location: Right Arm)    Pulse 68    Temp 98.2 F (36.8 C) (Oral)    Resp 18    Ht 5\' 1"  (1.549 m)    Wt 88.9 kg (196 lb)    LMP 11/07/2017 (Exact Date)    SpO2 99%    BMI 37.03 kg/m  If your blood pressure (BP) was elevated above 135/85 this visit, please have this repeated by your doctor within one month. --------------

## 2017-11-22 NOTE — ED Triage Notes (Signed)
Pt has had a headache for over a month. Out of medicine and has nasea  and cant get by anymore with th pain.

## 2017-11-22 NOTE — ED Notes (Signed)
NAD at this time. Pt is stable and going home.  
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
## 2017-12-27 ENCOUNTER — Ambulatory Visit (INDEPENDENT_AMBULATORY_CARE_PROVIDER_SITE_OTHER): Payer: Self-pay | Admitting: Neurology

## 2017-12-27 ENCOUNTER — Encounter: Payer: Self-pay | Admitting: Neurology

## 2017-12-27 VITALS — BP 108/78 | HR 75 | Ht 61.0 in | Wt 196.0 lb

## 2017-12-27 DIAGNOSIS — G43011 Migraine without aura, intractable, with status migrainosus: Secondary | ICD-10-CM

## 2017-12-27 MED ORDER — GALCANEZUMAB-GNLM 120 MG/ML ~~LOC~~ SOAJ: 120.0000 mg | SUBCUTANEOUS | 0 refills | Status: DC

## 2017-12-27 MED ORDER — SUMATRIPTAN SUCCINATE 100 MG PO TABS
ORAL_TABLET | ORAL | 3 refills | Status: DC
Start: 2017-12-27 — End: 2019-10-10

## 2017-12-27 NOTE — Patient Instructions (Addendum)
Migraine Recommendations: 1   We will start Emgality.  It is a monthly injection.  Once your new insurance starts and we will call in a prescription 2.  Take sumatriptan 100mg  at earliest onset of headache.  May repeat dose once in 2 hours if needed.  Do not exceed two tablets in 24 hours. 3.  Limit use of pain relievers to no more than 2 days out of the week.  These medications include acetaminophen, ibuprofen, triptans and narcotics.  This will help reduce risk of rebound headaches. 4.  Be aware of common food triggers such as processed sweets, processed foods with nitrites (such as deli meat, hot dogs, sausages), foods with MSG, alcohol (such as wine), chocolate, certain cheeses, certain fruits (dried fruits, bananas, some citrus fruit), vinegar, diet soda. 4.  Avoid caffeine 5.  Routine exercise 6.  Proper sleep hygiene 7.  Stay adequately hydrated with water 8.  Keep a headache diary. 9.  Maintain proper stress management. 10.  Do not skip meals. 11.  Consider supplements:  Magnesium citrate 400mg  to 600mg  daily, riboflavin 400mg , Coenzyme Q 10 100mg  three times daily 12.  Follow up in 4 months.

## 2018-02-02 ENCOUNTER — Emergency Department (HOSPITAL_BASED_OUTPATIENT_CLINIC_OR_DEPARTMENT_OTHER)
Admission: EM | Admit: 2018-02-02 | Discharge: 2018-02-02 | Disposition: A | Discharge status: Home / Self Care | Payer: No Typology Code available for payment source | Attending: Emergency Medicine | Admitting: Emergency Medicine

## 2018-02-02 ENCOUNTER — Encounter (HOSPITAL_BASED_OUTPATIENT_CLINIC_OR_DEPARTMENT_OTHER): Payer: Self-pay | Admitting: Emergency Medicine

## 2018-02-02 ENCOUNTER — Other Ambulatory Visit: Payer: Self-pay

## 2018-02-02 DIAGNOSIS — M545 Low back pain: Secondary | ICD-10-CM | POA: Insufficient documentation

## 2018-02-02 DIAGNOSIS — Z8679 Personal history of other diseases of the circulatory system: Secondary | ICD-10-CM | POA: Diagnosis not present

## 2018-02-02 MED ORDER — METHOCARBAMOL 500 MG PO TABS: 500.0000 mg | ORAL_TABLET | Freq: Two times a day (BID) | ORAL | 0 refills | Status: AC | PRN

## 2018-02-02 MED ORDER — IBUPROFEN 800 MG PO TABS: 800.0000 mg | ORAL_TABLET | Freq: Three times a day (TID) | ORAL | 0 refills | Status: DC

## 2018-02-02 MED ORDER — METHOCARBAMOL 500 MG PO TABS
500.0000 mg | ORAL_TABLET | Freq: Once | ORAL | Status: AC
  Administered 2018-02-02: 500 mg via ORAL
  Filled 2018-02-02: qty 1

## 2018-02-02 MED ORDER — KETOROLAC TROMETHAMINE 60 MG/2ML IM SOLN
60.0000 mg | Freq: Once | INTRAMUSCULAR | Status: AC
  Administered 2018-02-02: 60 mg via INTRAMUSCULAR
  Filled 2018-02-02: qty 2

## 2018-02-02 MED FILL — IBUPROFEN 800 MG TAB: 800 | 7 days supply | Qty: 21 | Fill #0

## 2018-02-02 MED FILL — METHOCARBAMOL 500 MG TABLET: 500 | 10 days supply | Qty: 20 | Fill #0
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
## 2018-02-02 NOTE — ED Notes (Signed)
ED Provider at bedside. 

## 2018-03-01 ENCOUNTER — Emergency Department (HOSPITAL_BASED_OUTPATIENT_CLINIC_OR_DEPARTMENT_OTHER)
Admission: EM | Admit: 2018-03-01 | Discharge: 2018-03-01 | Disposition: A | Discharge status: Home / Self Care | Payer: Self-pay | Attending: Emergency Medicine | Admitting: Emergency Medicine

## 2018-03-01 ENCOUNTER — Other Ambulatory Visit: Payer: Self-pay

## 2018-03-01 ENCOUNTER — Encounter (HOSPITAL_BASED_OUTPATIENT_CLINIC_OR_DEPARTMENT_OTHER): Payer: Self-pay | Admitting: Emergency Medicine

## 2018-03-01 DIAGNOSIS — Z79899 Other long term (current) drug therapy: Secondary | ICD-10-CM | POA: Insufficient documentation

## 2018-03-01 DIAGNOSIS — T162XXA Foreign body in left ear, initial encounter: Secondary | ICD-10-CM | POA: Insufficient documentation

## 2018-03-01 DIAGNOSIS — Y939 Activity, unspecified: Secondary | ICD-10-CM | POA: Insufficient documentation

## 2018-03-01 DIAGNOSIS — Y929 Unspecified place or not applicable: Secondary | ICD-10-CM | POA: Insufficient documentation

## 2018-03-01 DIAGNOSIS — Y998 Other external cause status: Secondary | ICD-10-CM | POA: Insufficient documentation

## 2018-03-01 DIAGNOSIS — Y33XXXA Other specified events, undetermined intent, initial encounter: Secondary | ICD-10-CM | POA: Insufficient documentation

## 2018-03-01 NOTE — ED Triage Notes (Signed)
Pt thinks she has the end of a Qtip in her LT ear since 0530 today

## 2018-03-01 NOTE — Discharge Instructions (Addendum)
Evaluated today for left ear pain.  You did have a portion of a Q-tip that was in your ear.  This was removed without difficulty.  Your tympanic membrane as well as the canal in your ear did not look like there is any evidence of trauma.  If you develop significant pain to your ear, decreased hearing, ear discharge please follow-up with your PCP or return to the emergency department for evaluation.  Return to the ED for any new or worsening symptoms.
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
## 2018-03-01 NOTE — ED Notes (Signed)
ED Provider at bedside. 

## 2018-05-04 ENCOUNTER — Ambulatory Visit: Payer: Self-pay | Admitting: Neurology

## 2018-06-09 ENCOUNTER — Encounter (HOSPITAL_BASED_OUTPATIENT_CLINIC_OR_DEPARTMENT_OTHER): Payer: Self-pay | Admitting: *Deleted

## 2018-06-09 ENCOUNTER — Other Ambulatory Visit: Payer: Self-pay

## 2018-06-09 ENCOUNTER — Emergency Department (HOSPITAL_BASED_OUTPATIENT_CLINIC_OR_DEPARTMENT_OTHER)
Admission: EM | Admit: 2018-06-09 | Discharge: 2018-06-09 | Disposition: A | Discharge status: Home / Self Care | Payer: Self-pay | Attending: Emergency Medicine | Admitting: Emergency Medicine

## 2018-06-09 DIAGNOSIS — J069 Acute upper respiratory infection, unspecified: Secondary | ICD-10-CM | POA: Insufficient documentation

## 2018-06-09 DIAGNOSIS — Z79899 Other long term (current) drug therapy: Secondary | ICD-10-CM | POA: Insufficient documentation

## 2018-06-09 MED ORDER — PSEUDOEPH-BROMPHEN-DM 30-2-10 MG/5ML PO SYRP: 5.0000 mL | ORAL_SOLUTION | Freq: Four times a day (QID) | ORAL | 0 refills | Status: DC | PRN

## 2018-06-09 NOTE — ED Triage Notes (Signed)
Chills, headache, body aches and cough since yesterday. No fever reducer today.
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
## 2018-06-09 NOTE — Discharge Instructions (Signed)
Thank you for allowing me to care for you today. Please return to the emergency department if you have new or worsening symptoms. Take your medications as instructed.  ° °

## 2018-06-13 ENCOUNTER — Ambulatory Visit: Payer: Self-pay | Admitting: Family

## 2018-10-26 ENCOUNTER — Encounter (HOSPITAL_BASED_OUTPATIENT_CLINIC_OR_DEPARTMENT_OTHER): Payer: Self-pay | Admitting: *Deleted

## 2018-10-26 ENCOUNTER — Other Ambulatory Visit: Payer: Self-pay

## 2018-10-26 ENCOUNTER — Emergency Department (HOSPITAL_BASED_OUTPATIENT_CLINIC_OR_DEPARTMENT_OTHER): Payer: Medicaid Other

## 2018-10-26 ENCOUNTER — Emergency Department (HOSPITAL_BASED_OUTPATIENT_CLINIC_OR_DEPARTMENT_OTHER)
Admission: EM | Admit: 2018-10-26 | Discharge: 2018-10-26 | Disposition: A | Discharge status: Home / Self Care | Payer: Medicaid Other | Attending: Emergency Medicine | Admitting: Emergency Medicine

## 2018-10-26 DIAGNOSIS — O26821 Pregnancy related peripheral neuritis, first trimester: Secondary | ICD-10-CM | POA: Diagnosis not present

## 2018-10-26 DIAGNOSIS — M7662 Achilles tendinitis, left leg: Secondary | ICD-10-CM

## 2018-10-26 DIAGNOSIS — B9689 Other specified bacterial agents as the cause of diseases classified elsewhere: Secondary | ICD-10-CM

## 2018-10-26 DIAGNOSIS — Z3490 Encounter for supervision of normal pregnancy, unspecified, unspecified trimester: Secondary | ICD-10-CM

## 2018-10-26 DIAGNOSIS — Z79899 Other long term (current) drug therapy: Secondary | ICD-10-CM | POA: Insufficient documentation

## 2018-10-26 DIAGNOSIS — N760 Acute vaginitis: Secondary | ICD-10-CM

## 2018-10-26 DIAGNOSIS — R202 Paresthesia of skin: Secondary | ICD-10-CM

## 2018-10-26 DIAGNOSIS — Z3A01 Less than 8 weeks gestation of pregnancy: Secondary | ICD-10-CM | POA: Insufficient documentation

## 2018-10-26 DIAGNOSIS — M7661 Achilles tendinitis, right leg: Secondary | ICD-10-CM | POA: Diagnosis not present

## 2018-10-26 DIAGNOSIS — O9989 Other specified diseases and conditions complicating pregnancy, childbirth and the puerperium: Secondary | ICD-10-CM | POA: Insufficient documentation

## 2018-10-26 DIAGNOSIS — O23591 Infection of other part of genital tract in pregnancy, first trimester: Secondary | ICD-10-CM | POA: Diagnosis not present

## 2018-10-26 LAB — HCG, QUANTITATIVE, PREGNANCY: hCG, Beta Chain, Quant, S: 829 m[IU]/mL — ABNORMAL HIGH (ref <5)

## 2018-10-26 LAB — CBC WITH DIFFERENTIAL/PLATELET
Abs Immature Granulocytes: 0.06 10*3/uL (ref 0.00–0.07)
Basophils Absolute: 0 10*3/uL (ref 0.0–0.1)
Basophils Relative: 0 %
Eosinophils Absolute: 0.1 10*3/uL (ref 0.0–0.5)
Eosinophils Relative: 1 %
HCT: 39.1 % (ref 36.0–46.0)
Hemoglobin: 12.8 g/dL (ref 12.0–15.0)
Immature Granulocytes: 1 %
Lymphocytes Relative: 29 %
Lymphs Abs: 1.6 10*3/uL (ref 0.7–4.0)
MCH: 26.3 pg (ref 26.0–34.0)
MCHC: 32.7 g/dL (ref 30.0–36.0)
MCV: 80.5 fL (ref 80.0–100.0)
Monocytes Absolute: 0.5 10*3/uL (ref 0.1–1.0)
Monocytes Relative: 9 %
Neutro Abs: 3.3 10*3/uL (ref 1.7–7.7)
Neutrophils Relative %: 60 %
Platelets: 310 10*3/uL (ref 150–400)
RBC: 4.86 MIL/uL (ref 3.87–5.11)
RDW: 14 % (ref 11.5–15.5)
Smear Review: NORMAL
WBC: 5.6 10*3/uL (ref 4.0–10.5)
nRBC: 0 % (ref 0.0–0.2)

## 2018-10-26 LAB — COMPREHENSIVE METABOLIC PANEL
ALT: 16 U/L (ref 0–44)
AST: 17 U/L (ref 15–41)
Albumin: 4 g/dL (ref 3.5–5.0)
Alkaline Phosphatase: 68 U/L (ref 38–126)
Anion gap: 8 (ref 5–15)
BUN: 8 mg/dL (ref 6–20)
CO2: 22 mmol/L (ref 22–32)
Calcium: 8.6 mg/dL — ABNORMAL LOW (ref 8.9–10.3)
Chloride: 107 mmol/L (ref 98–111)
Creatinine, Ser: 0.59 mg/dL (ref 0.44–1.00)
GFR calc Af Amer: >60 mL/min (ref >60)
GFR calc non Af Amer: >60 mL/min (ref >60)
Glucose, Bld: 103 mg/dL — ABNORMAL HIGH (ref 70–99)
Potassium: 3.4 mmol/L — ABNORMAL LOW (ref 3.5–5.1)
Sodium: 137 mmol/L (ref 135–145)
Total Bilirubin: 0.4 mg/dL (ref 0.3–1.2)
Total Protein: 7.3 g/dL (ref 6.5–8.1)

## 2018-10-26 LAB — URINALYSIS, ROUTINE W REFLEX MICROSCOPIC
Bilirubin Urine: NEGATIVE
Glucose, UA: NEGATIVE mg/dL
Hgb urine dipstick: NEGATIVE
Ketones, ur: >80 mg/dL — AB
Nitrite: NEGATIVE
Protein, ur: NEGATIVE mg/dL
Specific Gravity, Urine: 1.025 (ref 1.005–1.030)
pH: 5.5 (ref 5.0–8.0)

## 2018-10-26 LAB — WET PREP, GENITAL
Sperm: NONE SEEN
Trich, Wet Prep: NONE SEEN
Yeast Wet Prep HPF POC: NONE SEEN

## 2018-10-26 LAB — TSH: TSH: 0.879 u[IU]/mL (ref 0.350–4.500)

## 2018-10-26 LAB — PREGNANCY, URINE: Preg Test, Ur: POSITIVE — AB

## 2018-10-26 LAB — URINALYSIS, MICROSCOPIC (REFLEX)

## 2018-10-26 MED ORDER — METRONIDAZOLE 500 MG PO TABS: 500.0000 mg | ORAL_TABLET | Freq: Two times a day (BID) | ORAL | 0 refills | Status: DC

## 2018-10-26 MED ORDER — PRENATAL VITAMINS 28-0.8 MG PO TABS: 1.0000 | ORAL_TABLET | Freq: Every day | ORAL | 0 refills | Status: AC

## 2018-10-26 NOTE — ED Triage Notes (Signed)
Tingling in her fingers and toes for 3 days. She had a positive pregnancy test July 4th. She thinks the tingling is related to pregnancy.
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
## 2018-10-26 NOTE — ED Notes (Signed)
Pt. Just returned from Radiology 

## 2018-10-26 NOTE — Discharge Instructions (Signed)
Do the exercises I showed you for your achilles tendonitis twice a day.

## 2018-10-26 NOTE — ED Notes (Signed)
Pt states LMP 6/620

## 2018-10-27 LAB — GC/CHLAMYDIA PROBE AMP (~~LOC~~) NOT AT ARMC
Chlamydia: NEGATIVE
Neisseria Gonorrhea: NEGATIVE

## 2018-11-11 ENCOUNTER — Encounter (HOSPITAL_BASED_OUTPATIENT_CLINIC_OR_DEPARTMENT_OTHER): Payer: Self-pay | Admitting: Emergency Medicine

## 2018-11-11 ENCOUNTER — Emergency Department (HOSPITAL_BASED_OUTPATIENT_CLINIC_OR_DEPARTMENT_OTHER): Payer: Medicaid Other

## 2018-11-11 ENCOUNTER — Other Ambulatory Visit: Payer: Self-pay

## 2018-11-11 ENCOUNTER — Emergency Department (HOSPITAL_BASED_OUTPATIENT_CLINIC_OR_DEPARTMENT_OTHER)
Admission: EM | Admit: 2018-11-11 | Discharge: 2018-11-11 | Disposition: A | Discharge status: Home / Self Care | Payer: Medicaid Other | Attending: Emergency Medicine | Admitting: Emergency Medicine

## 2018-11-11 DIAGNOSIS — Z3A01 Less than 8 weeks gestation of pregnancy: Secondary | ICD-10-CM | POA: Insufficient documentation

## 2018-11-11 DIAGNOSIS — N3090 Cystitis, unspecified without hematuria: Secondary | ICD-10-CM | POA: Insufficient documentation

## 2018-11-11 DIAGNOSIS — J020 Streptococcal pharyngitis: Secondary | ICD-10-CM

## 2018-11-11 DIAGNOSIS — O26891 Other specified pregnancy related conditions, first trimester: Secondary | ICD-10-CM | POA: Diagnosis not present

## 2018-11-11 DIAGNOSIS — J029 Acute pharyngitis, unspecified: Secondary | ICD-10-CM | POA: Insufficient documentation

## 2018-11-11 DIAGNOSIS — N3000 Acute cystitis without hematuria: Secondary | ICD-10-CM

## 2018-11-11 LAB — CBC
HCT: 37.5 % (ref 36.0–46.0)
Hemoglobin: 12.3 g/dL (ref 12.0–15.0)
MCH: 26.5 pg (ref 26.0–34.0)
MCHC: 32.8 g/dL (ref 30.0–36.0)
MCV: 80.8 fL (ref 80.0–100.0)
Platelets: 288 10*3/uL (ref 150–400)
RBC: 4.64 MIL/uL (ref 3.87–5.11)
RDW: 13.8 % (ref 11.5–15.5)
WBC: 8.5 10*3/uL (ref 4.0–10.5)
nRBC: 0 % (ref 0.0–0.2)

## 2018-11-11 LAB — LIPASE, BLOOD: Lipase: 22 U/L (ref 11–51)

## 2018-11-11 LAB — COMPREHENSIVE METABOLIC PANEL
ALT: 19 U/L (ref 0–44)
AST: 17 U/L (ref 15–41)
Albumin: 3.9 g/dL (ref 3.5–5.0)
Alkaline Phosphatase: 75 U/L (ref 38–126)
Anion gap: 11 (ref 5–15)
BUN: 7 mg/dL (ref 6–20)
CO2: 21 mmol/L — ABNORMAL LOW (ref 22–32)
Calcium: 8.8 mg/dL — ABNORMAL LOW (ref 8.9–10.3)
Chloride: 104 mmol/L (ref 98–111)
Creatinine, Ser: 0.68 mg/dL (ref 0.44–1.00)
GFR calc Af Amer: >60 mL/min (ref >60)
GFR calc non Af Amer: >60 mL/min (ref >60)
Glucose, Bld: 102 mg/dL — ABNORMAL HIGH (ref 70–99)
Potassium: 3.7 mmol/L (ref 3.5–5.1)
Sodium: 136 mmol/L (ref 135–145)
Total Bilirubin: 0.4 mg/dL (ref 0.3–1.2)
Total Protein: 7.1 g/dL (ref 6.5–8.1)

## 2018-11-11 LAB — URINALYSIS, MICROSCOPIC (REFLEX): RBC / HPF: NONE SEEN RBC/hpf (ref 0–5)

## 2018-11-11 LAB — GROUP A STREP BY PCR: Group A Strep by PCR: DETECTED — AB

## 2018-11-11 LAB — URINALYSIS, ROUTINE W REFLEX MICROSCOPIC
Bilirubin Urine: NEGATIVE
Glucose, UA: NEGATIVE mg/dL
Hgb urine dipstick: NEGATIVE
Ketones, ur: 15 mg/dL — AB
Nitrite: NEGATIVE
Protein, ur: NEGATIVE mg/dL
Specific Gravity, Urine: 1.025 (ref 1.005–1.030)
pH: 6 (ref 5.0–8.0)

## 2018-11-11 LAB — HCG, QUANTITATIVE, PREGNANCY: hCG, Beta Chain, Quant, S: 60190 m[IU]/mL — ABNORMAL HIGH (ref <5)

## 2018-11-11 MED ORDER — PENICILLIN G BENZATHINE 1200000 UNIT/2ML IM SUSP
1.2000 10*6.[IU] | Freq: Once | INTRAMUSCULAR | Status: AC
  Administered 2018-11-11: 1.2 10*6.[IU] via INTRAMUSCULAR
  Filled 2018-11-11: qty 2

## 2018-11-11 MED ORDER — NITROFURANTOIN MONOHYD MACRO 100 MG PO CAPS: 100.0000 mg | ORAL_CAPSULE | Freq: Two times a day (BID) | ORAL | 0 refills | Status: DC

## 2018-11-11 NOTE — Discharge Instructions (Addendum)
You are given a shot of penicillin that should take care of your strep throat.  The ultrasound showed your at 6 weeks 3 days.  The urine sample does suggest the possibility of a urinary tract infection.  I have prescribed a course of antibiotics to take for that.
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
## 2018-11-11 NOTE — ED Notes (Signed)
Patient transported to Ultrasound 

## 2018-11-11 NOTE — ED Notes (Signed)
ED Provider at bedside. 

## 2018-11-11 NOTE — ED Triage Notes (Signed)
Pt c/o LUQ pain x 2 days. She is pregnant, unsure how far along. Also c/o sore throat.

## 2019-07-03 MED ORDER — VARICELLA VIRUS VACCINE LIVE 1350 PFU/0.5ML ~~LOC~~ INJ
0.50 | INJECTION | SUBCUTANEOUS | Status: DC
Start: ? — End: 2019-07-03

## 2019-07-03 MED ORDER — FERROUS SULFATE 325 (65 FE) MG PO TABS
325.00 | ORAL_TABLET | ORAL | Status: DC
Start: 2019-07-04 — End: 2019-07-03

## 2019-07-03 MED ORDER — FAMOTIDINE 20 MG PO TABS
20.00 | ORAL_TABLET | ORAL | Status: DC
Start: ? — End: 2019-07-03

## 2019-07-03 MED ORDER — EPHEDRINE SULFATE 50 MG/ML IJ SOLN
5.00 | INTRAMUSCULAR | Status: DC
Start: ? — End: 2019-07-03

## 2019-07-03 MED ORDER — DIPHENHYDRAMINE HCL 25 MG PO CAPS
25.00 | ORAL_CAPSULE | ORAL | Status: DC
Start: ? — End: 2019-07-03

## 2019-07-03 MED ORDER — MEASLES, MUMPS & RUBELLA VAC IJ SOLR
0.50 | INTRAMUSCULAR | Status: DC
Start: ? — End: 2019-07-03

## 2019-07-03 MED ORDER — IBUPROFEN 400 MG PO TABS
800.00 | ORAL_TABLET | ORAL | Status: DC
Start: 2019-07-03 — End: 2019-07-03

## 2019-07-03 MED ORDER — GENERIC EXTERNAL MEDICATION
Status: DC
Start: ? — End: 2019-07-03

## 2019-07-03 MED ORDER — DSS 100 MG PO CAPS
100.00 | ORAL_CAPSULE | ORAL | Status: DC
Start: 2019-07-03 — End: 2019-07-03

## 2019-07-03 MED ORDER — WITCH HAZEL-GLYCERIN EX PADS
1.00 | MEDICATED_PAD | CUTANEOUS | Status: DC
Start: ? — End: 2019-07-03

## 2019-07-03 MED ORDER — MAGNESIUM HYDROXIDE 400 MG/5ML PO SUSP
30.00 | ORAL | Status: DC
Start: ? — End: 2019-07-03

## 2019-07-03 MED ORDER — ALUM & MAG HYDROXIDE-SIMETH 200-200-20 MG/5ML PO SUSP
30.00 | ORAL | Status: DC
Start: ? — End: 2019-07-03

## 2019-07-03 MED ORDER — BISACODYL 10 MG RE SUPP
10.00 | RECTAL | Status: DC
Start: ? — End: 2019-07-03

## 2019-07-03 MED ORDER — SODIUM CHLORIDE FLUSH 0.9 % IV SOLN
10.00 | INTRAVENOUS | Status: DC
Start: 2019-07-03 — End: 2019-07-03

## 2019-07-03 MED ORDER — OXYTOCIN-SODIUM CHLORIDE 30-0.9 UT/500ML-% IV SOLN
30.00 | INTRAVENOUS | Status: DC
Start: ? — End: 2019-07-03

## 2019-07-03 MED ORDER — BENZOCAINE-MENTHOL 20-0.5 % EX AERO
INHALATION_SPRAY | CUTANEOUS | Status: DC
Start: ? — End: 2019-07-03

## 2019-07-03 MED ORDER — OXYCODONE HCL 5 MG PO TABS
5.00 | ORAL_TABLET | ORAL | Status: DC
Start: ? — End: 2019-07-03

## 2019-07-03 MED ORDER — SODIUM CHLORIDE FLUSH 0.9 % IV SOLN
10.00 | INTRAVENOUS | Status: DC
Start: ? — End: 2019-07-03

## 2019-07-03 MED ORDER — SIMETHICONE 80 MG PO CHEW
80.00 | CHEWABLE_TABLET | ORAL | Status: DC
Start: ? — End: 2019-07-03

## 2019-07-03 MED ORDER — ACETAMINOPHEN 325 MG PO TABS
650.00 | ORAL_TABLET | ORAL | Status: DC
Start: ? — End: 2019-07-03

## 2019-07-03 MED ORDER — PRENATAL 28-0.8 MG PO TABS
1.00 | ORAL_TABLET | ORAL | Status: DC
Start: 2019-07-04 — End: 2019-07-03

## 2019-07-03 MED ORDER — TETANUS-DIPHTH-ACELL PERTUSSIS 5-2.5-18.5 LF-MCG/0.5 IM SUSP
0.50 | INTRAMUSCULAR | Status: DC
Start: ? — End: 2019-07-03

## 2019-07-03 MED ORDER — MORPHINE SULFATE 2 MG/ML IJ SOLN
2.00 | INTRAMUSCULAR | Status: DC
Start: ? — End: 2019-07-03

## 2019-07-03 MED ORDER — SENNOSIDES-DOCUSATE SODIUM 8.6-50 MG PO TABS
1.00 | ORAL_TABLET | ORAL | Status: DC
Start: ? — End: 2019-07-03

## 2019-07-03 MED ORDER — ENEMA 7-19 GM/118ML RE ENEM
1.00 | ENEMA | RECTAL | Status: DC
Start: ? — End: 2019-07-03

## 2019-07-16 IMAGING — CT CT HEAD W/O CM
3 series · 16 of 47 positions shown, 19 images · non-contrast
Comparison: MRI of the brain on 11/09/2015

CLINICAL DATA: Vertigo, nausea and vomiting.

EXAM:
CT HEAD WITHOUT CONTRAST
TECHNIQUE: Contiguous axial images were obtained from the base of the skull
through the vertex without intravenous contrast.

[Series 2: head wo · axial · 0.39mm/px · z∈[+1132,+1258]mm · 10 of 30 slices shown, 13 images]
[im 3/30  brain]
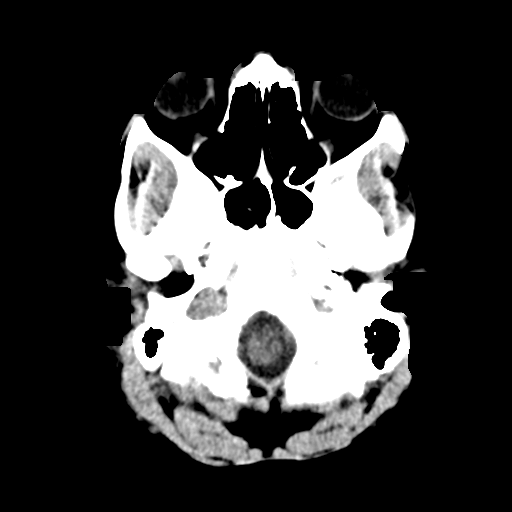
[im 3/30  bone]
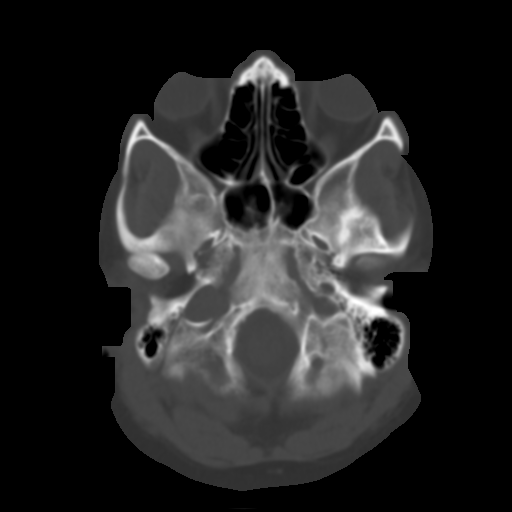
[im 6/30  brain]
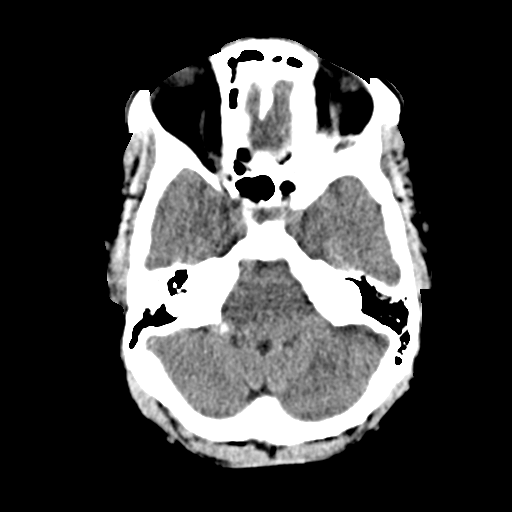
[im 9/30  brain]
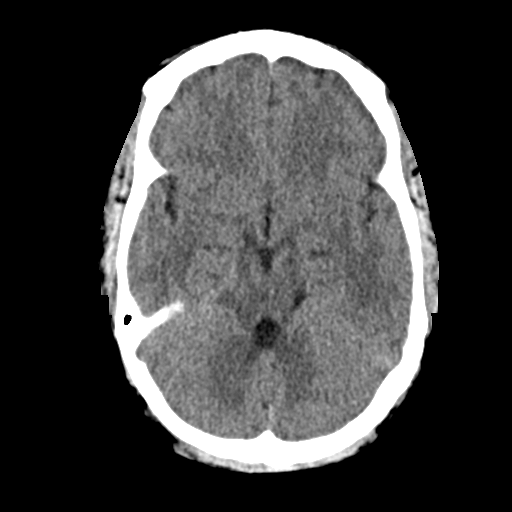
[im 11/30  brain]
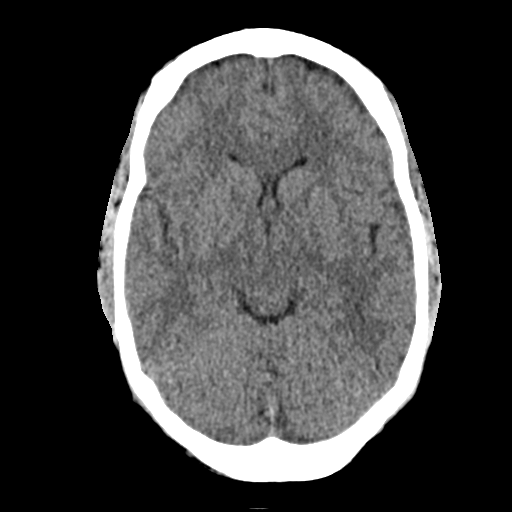
[im 14/30  brain]
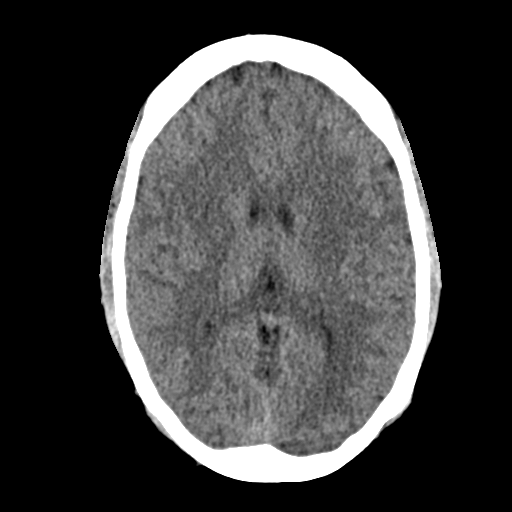
[im 14/30  bone]
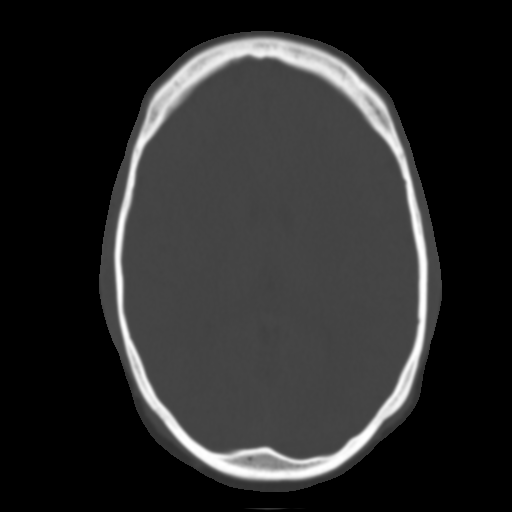
[im 17/30  brain]
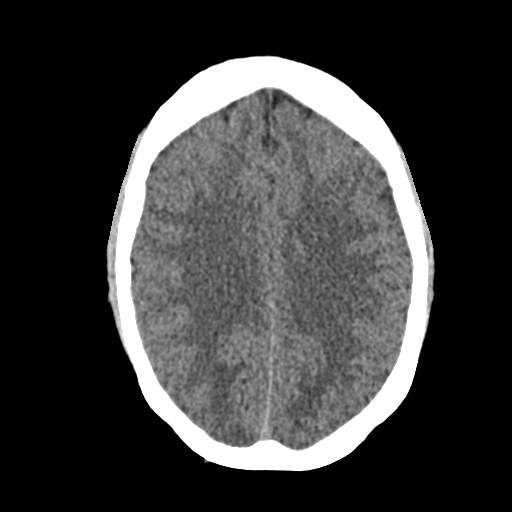
[im 20/30  brain]
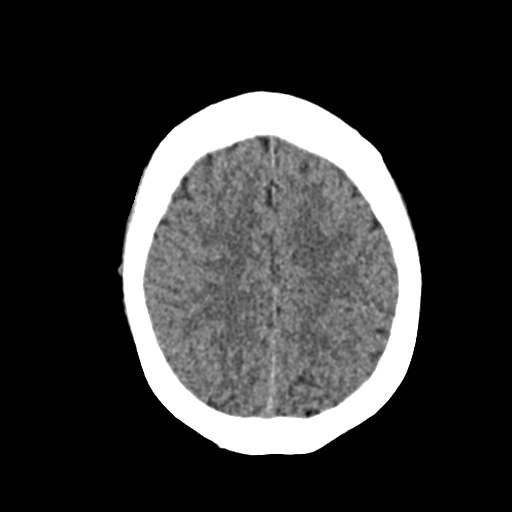
[im 23/30  brain]
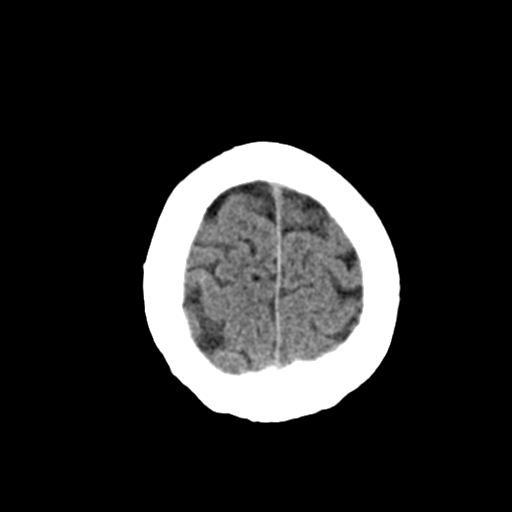
[im 25/30  brain]
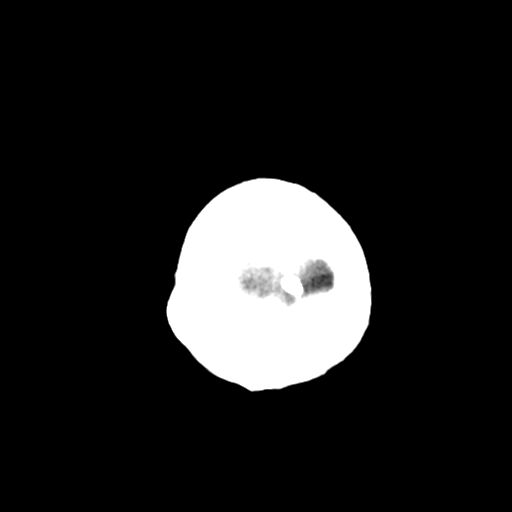
[im 25/30  bone]
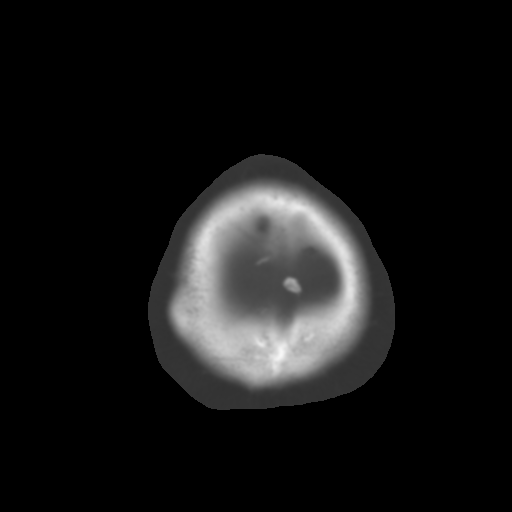
[im 28/30  brain]
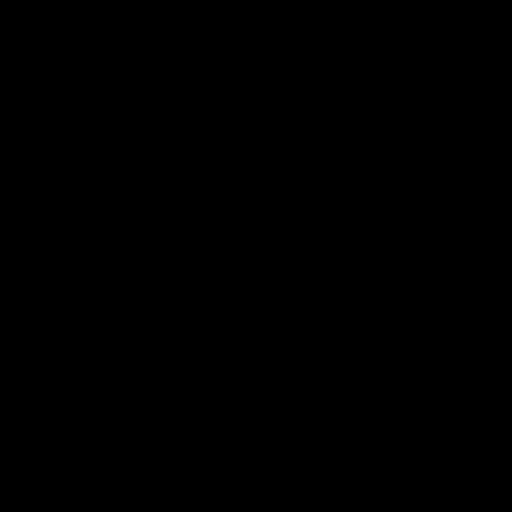

[Series 4: coronal soft · coronal · 0.29mm/px · 3 of 67 slices shown]
[im 23/67  brain]
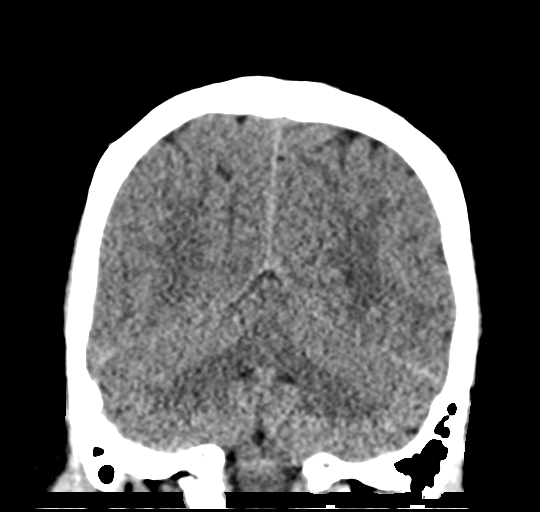
[im 30/67  brain]
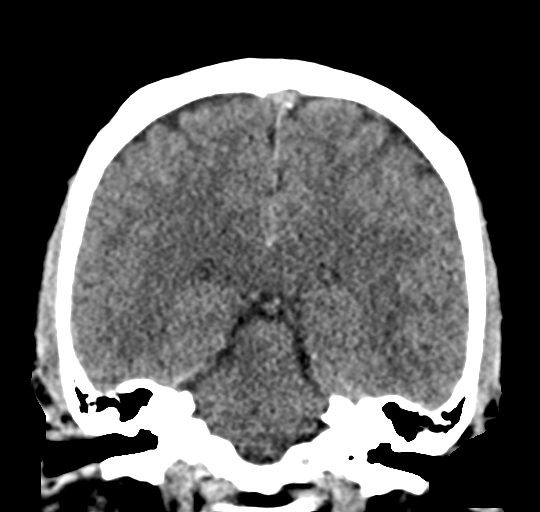
[im 37/67  brain]
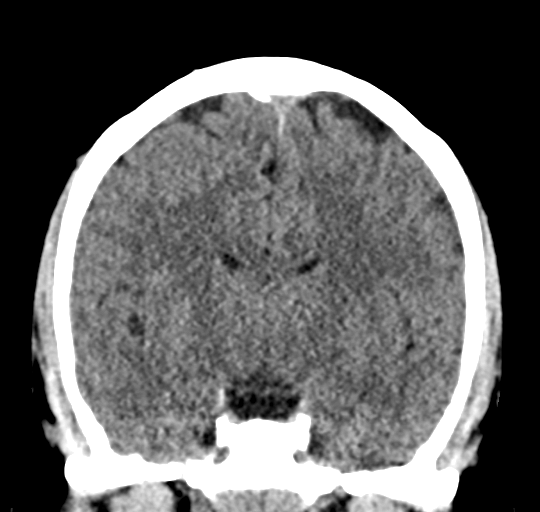

[Series 5: sag soft · sagittal · 0.29mm/px · 3 of 52 slices shown]
[im 18/52  brain]
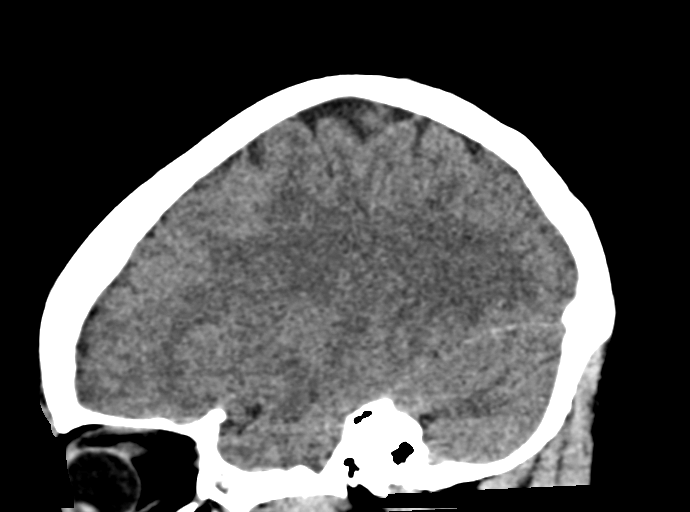
[im 26/52  brain]
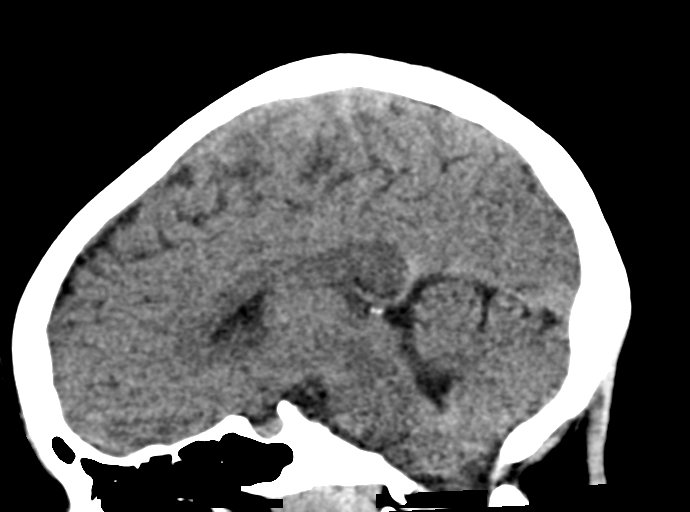
[im 35/52  brain]
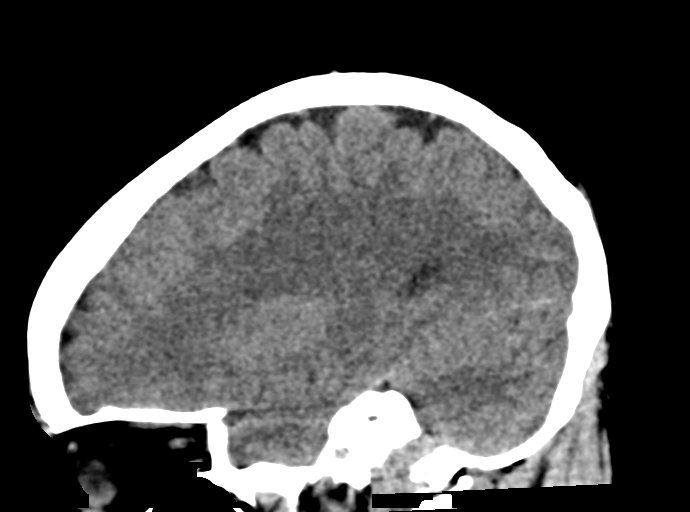

[16 of 47 positions shown; findings below may reference images not displayed]

FINDINGS: Brain: No evidence of acute infarction, hemorrhage, hydrocephalus,
extra-axial collection or mass lesion/mass effect.

Vascular: No hyperdense vessel or unexpected calcification.

Skull: Normal. Negative for fracture or focal lesion.

Sinuses/Orbits: No acute finding.

Other: None.
IMPRESSION: Normal head CT.

## 2019-10-10 ENCOUNTER — Encounter (HOSPITAL_BASED_OUTPATIENT_CLINIC_OR_DEPARTMENT_OTHER): Payer: Self-pay

## 2019-10-10 ENCOUNTER — Other Ambulatory Visit: Payer: Self-pay

## 2019-10-10 ENCOUNTER — Emergency Department (HOSPITAL_BASED_OUTPATIENT_CLINIC_OR_DEPARTMENT_OTHER)
Admission: EM | Admit: 2019-10-10 | Discharge: 2019-10-10 | Disposition: A | Discharge status: Home / Self Care | Payer: Medicaid Other | Attending: Emergency Medicine | Admitting: Emergency Medicine

## 2019-10-10 ENCOUNTER — Emergency Department (HOSPITAL_BASED_OUTPATIENT_CLINIC_OR_DEPARTMENT_OTHER): Payer: Medicaid Other

## 2019-10-10 DIAGNOSIS — R1032 Left lower quadrant pain: Secondary | ICD-10-CM | POA: Diagnosis not present

## 2019-10-10 DIAGNOSIS — R102 Pelvic and perineal pain: Secondary | ICD-10-CM

## 2019-10-10 DIAGNOSIS — R109 Unspecified abdominal pain: Secondary | ICD-10-CM | POA: Diagnosis present

## 2019-10-10 DIAGNOSIS — H9209 Otalgia, unspecified ear: Secondary | ICD-10-CM | POA: Diagnosis not present

## 2019-10-10 LAB — URINALYSIS, ROUTINE W REFLEX MICROSCOPIC
Bilirubin Urine: NEGATIVE
Glucose, UA: NEGATIVE mg/dL
Hgb urine dipstick: NEGATIVE
Ketones, ur: NEGATIVE mg/dL
Leukocytes,Ua: NEGATIVE
Nitrite: NEGATIVE
Protein, ur: NEGATIVE mg/dL
Specific Gravity, Urine: 1.02 (ref 1.005–1.030)
pH: 5.5 (ref 5.0–8.0)

## 2019-10-10 LAB — WET PREP, GENITAL
Clue Cells Wet Prep HPF POC: NONE SEEN
Sperm: NONE SEEN
Trich, Wet Prep: NONE SEEN
Yeast Wet Prep HPF POC: NONE SEEN

## 2019-10-10 LAB — PREGNANCY, URINE: Preg Test, Ur: NEGATIVE

## 2019-10-10 MED ORDER — DOXYCYCLINE HYCLATE 100 MG PO TABS
100.0000 mg | ORAL_TABLET | Freq: Once | ORAL | Status: AC
  Administered 2019-10-10: 100 mg via ORAL
  Filled 2019-10-10: qty 1

## 2019-10-10 MED ORDER — CEFTRIAXONE SODIUM 500 MG IJ SOLR
500.0000 mg | Freq: Once | INTRAMUSCULAR | Status: AC
  Administered 2019-10-10: 500 mg via INTRAMUSCULAR
  Filled 2019-10-10: qty 500

## 2019-10-10 MED ORDER — DOXYCYCLINE HYCLATE 100 MG PO CAPS: 100.0000 mg | ORAL_CAPSULE | Freq: Two times a day (BID) | ORAL | 0 refills | Status: AC

## 2019-10-10 NOTE — ED Triage Notes (Signed)
Pt arrives with c/o pain to LLQ states history of UTI, pain X3 days. Pt also c/o fullness and pain to both ears states that she has had some dizziness with the ear pain. Also reports she has sick children in the home at this time.

## 2019-10-10 NOTE — Discharge Instructions (Signed)
Please follow up with your OB.  Return for fever, inability to eat or drink or worsening pain.
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
## 2019-10-11 LAB — GC/CHLAMYDIA PROBE AMP (~~LOC~~) NOT AT ARMC
Chlamydia: NEGATIVE
Comment: NEGATIVE
Comment: NORMAL
Neisseria Gonorrhea: NEGATIVE

## 2019-11-13 ENCOUNTER — Encounter: Payer: Medicaid Other | Admitting: Family

## 2020-11-25 ENCOUNTER — Encounter (HOSPITAL_BASED_OUTPATIENT_CLINIC_OR_DEPARTMENT_OTHER): Payer: Self-pay | Admitting: Emergency Medicine

## 2020-11-25 ENCOUNTER — Inpatient Hospital Stay (HOSPITAL_BASED_OUTPATIENT_CLINIC_OR_DEPARTMENT_OTHER)
Admission: EM | Admit: 2020-11-25 | Discharge: 2020-11-25 | Disposition: A | Discharge status: Home / Self Care | Payer: PRIVATE HEALTH INSURANCE | Attending: Obstetrics & Gynecology | Admitting: Obstetrics & Gynecology

## 2020-11-25 ENCOUNTER — Inpatient Hospital Stay (HOSPITAL_COMMUNITY): Payer: PRIVATE HEALTH INSURANCE

## 2020-11-25 ENCOUNTER — Other Ambulatory Visit: Payer: Self-pay

## 2020-11-25 DIAGNOSIS — B373 Candidiasis of vulva and vagina: Secondary | ICD-10-CM | POA: Diagnosis not present

## 2020-11-25 DIAGNOSIS — O98811 Other maternal infectious and parasitic diseases complicating pregnancy, first trimester: Secondary | ICD-10-CM | POA: Diagnosis not present

## 2020-11-25 DIAGNOSIS — O3680X0 Pregnancy with inconclusive fetal viability, not applicable or unspecified: Secondary | ICD-10-CM | POA: Diagnosis not present

## 2020-11-25 DIAGNOSIS — Z3A10 10 weeks gestation of pregnancy: Secondary | ICD-10-CM | POA: Diagnosis not present

## 2020-11-25 DIAGNOSIS — B3731 Acute candidiasis of vulva and vagina: Secondary | ICD-10-CM

## 2020-11-25 DIAGNOSIS — O26891 Other specified pregnancy related conditions, first trimester: Secondary | ICD-10-CM | POA: Diagnosis not present

## 2020-11-25 DIAGNOSIS — R109 Unspecified abdominal pain: Secondary | ICD-10-CM | POA: Diagnosis present

## 2020-11-25 LAB — COMPREHENSIVE METABOLIC PANEL
ALT: 15 U/L (ref 0–44)
AST: 15 U/L (ref 15–41)
Albumin: 3.8 g/dL (ref 3.5–5.0)
Alkaline Phosphatase: 82 U/L (ref 38–126)
Anion gap: 7 (ref 5–15)
BUN: 7 mg/dL (ref 6–20)
CO2: 24 mmol/L (ref 22–32)
Calcium: 8.6 mg/dL — ABNORMAL LOW (ref 8.9–10.3)
Chloride: 105 mmol/L (ref 98–111)
Creatinine, Ser: 0.69 mg/dL (ref 0.44–1.00)
GFR, Estimated: >60 mL/min (ref >60)
Glucose, Bld: 113 mg/dL — ABNORMAL HIGH (ref 70–99)
Potassium: 3.6 mmol/L (ref 3.5–5.1)
Sodium: 136 mmol/L (ref 135–145)
Total Bilirubin: 0.5 mg/dL (ref 0.3–1.2)
Total Protein: 7.3 g/dL (ref 6.5–8.1)

## 2020-11-25 LAB — CBC WITH DIFFERENTIAL/PLATELET
Abs Immature Granulocytes: 0.04 10*3/uL (ref 0.00–0.07)
Basophils Absolute: 0 10*3/uL (ref 0.0–0.1)
Basophils Relative: 1 %
Eosinophils Absolute: 0.1 10*3/uL (ref 0.0–0.5)
Eosinophils Relative: 1 %
HCT: 37.2 % (ref 36.0–46.0)
Hemoglobin: 12.7 g/dL (ref 12.0–15.0)
Immature Granulocytes: 1 %
Lymphocytes Relative: 34 %
Lymphs Abs: 2.4 10*3/uL (ref 0.7–4.0)
MCH: 26.5 pg (ref 26.0–34.0)
MCHC: 34.1 g/dL (ref 30.0–36.0)
MCV: 77.5 fL — ABNORMAL LOW (ref 80.0–100.0)
Monocytes Absolute: 0.4 10*3/uL (ref 0.1–1.0)
Monocytes Relative: 6 %
Neutro Abs: 4.1 10*3/uL (ref 1.7–7.7)
Neutrophils Relative %: 57 %
Platelets: 311 10*3/uL (ref 150–400)
RBC: 4.8 MIL/uL (ref 3.87–5.11)
RDW: 14.9 % (ref 11.5–15.5)
WBC: 7.1 10*3/uL (ref 4.0–10.5)
nRBC: 0 % (ref 0.0–0.2)

## 2020-11-25 LAB — URINALYSIS, ROUTINE W REFLEX MICROSCOPIC
Bilirubin Urine: NEGATIVE
Glucose, UA: NEGATIVE mg/dL
Hgb urine dipstick: NEGATIVE
Ketones, ur: NEGATIVE mg/dL
Nitrite: NEGATIVE
Protein, ur: NEGATIVE mg/dL
Specific Gravity, Urine: 1.02 (ref 1.005–1.030)
pH: 6 (ref 5.0–8.0)

## 2020-11-25 LAB — URINALYSIS, MICROSCOPIC (REFLEX)

## 2020-11-25 LAB — HCG, QUANTITATIVE, PREGNANCY: hCG, Beta Chain, Quant, S: 29851 m[IU]/mL — ABNORMAL HIGH (ref <5)

## 2020-11-25 LAB — ABO/RH: ABO/RH(D): A POS

## 2020-11-25 MED ORDER — TERCONAZOLE 0.4 % VA CREA: 1.0000 | TOPICAL_CREAM | Freq: Every day | VAGINAL | 0 refills | Status: AC

## 2020-11-25 MED ORDER — ACETAMINOPHEN 325 MG PO TABS
650.0000 mg | ORAL_TABLET | Freq: Once | ORAL | Status: AC
  Administered 2020-11-25: 650 mg via ORAL
  Filled 2020-11-25: qty 2

## 2020-11-25 MED ORDER — LACTATED RINGERS IV BOLUS
1000.0000 mL | Freq: Once | INTRAVENOUS | Status: AC
  Administered 2020-11-25: 1000 mL via INTRAVENOUS

## 2020-11-25 NOTE — ED Notes (Signed)
Report called to [facility]
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
## 2020-11-25 NOTE — ED Triage Notes (Addendum)
Pt states she is about 6 weeks preg  and was dx with UTI yesterday has not picked up her meds  that were rx  ( thinks it is keflex), states has had lower abd pain and back pain x 2 days G4 P2 A0 L2

## 2020-11-25 NOTE — Discharge Instructions (Signed)
Return to care  If you have heavier bleeding that soaks through more than 2 pads per hour for an hour or more If you bleed so much that you feel like you might pass out or you do pass out If you have significant abdominal pain that is not improved with Tylenol   

## 2020-11-25 NOTE — MAU Note (Signed)
Pt sent from [facility] secondary needs ultrasound and ultrasound unavailable @ facility today.   LMP 09/14/2020.  Reports spotting that began yesterday and abdominal pain.
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
## 2022-04-20 ENCOUNTER — Encounter (HOSPITAL_BASED_OUTPATIENT_CLINIC_OR_DEPARTMENT_OTHER): Payer: Self-pay | Admitting: *Deleted

## 2022-04-20 ENCOUNTER — Emergency Department (HOSPITAL_BASED_OUTPATIENT_CLINIC_OR_DEPARTMENT_OTHER)
Admission: EM | Admit: 2022-04-20 | Discharge: 2022-04-20 | Disposition: A | Discharge status: Home / Self Care | Payer: BC Managed Care – PPO | Attending: Emergency Medicine | Admitting: Emergency Medicine

## 2022-04-20 DIAGNOSIS — J111 Influenza due to unidentified influenza virus with other respiratory manifestations: Secondary | ICD-10-CM

## 2022-04-20 DIAGNOSIS — M791 Myalgia, unspecified site: Secondary | ICD-10-CM | POA: Diagnosis present

## 2022-04-20 DIAGNOSIS — J101 Influenza due to other identified influenza virus with other respiratory manifestations: Secondary | ICD-10-CM | POA: Diagnosis not present

## 2022-04-20 DIAGNOSIS — Z1152 Encounter for screening for COVID-19: Secondary | ICD-10-CM | POA: Insufficient documentation

## 2022-04-20 LAB — RESP PANEL BY RT-PCR (RSV, FLU A&B, COVID)  RVPGX2
Influenza A by PCR: NEGATIVE
Influenza B by PCR: NEGATIVE
Resp Syncytial Virus by PCR: NEGATIVE
SARS Coronavirus 2 by RT PCR: NEGATIVE

## 2022-04-20 MED ORDER — FLUTICASONE PROPIONATE 50 MCG/ACT NA SUSP: 2.0000 | Freq: Every day | NASAL | 0 refills | Status: AC

## 2022-04-20 MED ORDER — IBUPROFEN 800 MG PO TABS
800.0000 mg | ORAL_TABLET | Freq: Once | ORAL | Status: AC
  Administered 2022-04-20: 800 mg via ORAL
  Filled 2022-04-20: qty 1

## 2022-04-20 MED ORDER — IBUPROFEN 800 MG PO TABS: 800.0000 mg | ORAL_TABLET | Freq: Three times a day (TID) | ORAL | 0 refills | Status: AC | PRN

## 2022-04-20 MED ORDER — BENZONATATE 100 MG PO CAPS: 100.0000 mg | ORAL_CAPSULE | Freq: Three times a day (TID) | ORAL | 0 refills | Status: AC | PRN

## 2022-04-20 NOTE — ED Triage Notes (Signed)
Began having HA, body aches, and chills onset in the past 24 hours. Has not taken any OTC meds.

## 2022-04-20 NOTE — Discharge Instructions (Signed)
You were seen in the emergency room today with flulike symptoms.  Your COVID, flu, RSV testing was all negative.  Please continue supportive care and follow-up with your primary care physician.
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
